# Patient Record
Sex: Female | Born: 1980 | Race: Black or African American | Hispanic: No | Marital: Married | Smoking: Never smoker
Health system: Southern US, Community
[De-identification: ages and names within clinical notes are randomized; demographics above are authoritative.]

## PROBLEM LIST (undated history)

## (undated) DIAGNOSIS — F419 Anxiety disorder, unspecified: Secondary | ICD-10-CM

## (undated) DIAGNOSIS — G43909 Migraine, unspecified, not intractable, without status migrainosus: Secondary | ICD-10-CM

## (undated) DIAGNOSIS — F32A Depression, unspecified: Secondary | ICD-10-CM

## (undated) DIAGNOSIS — IMO0002 Reserved for concepts with insufficient information to code with codable children: Secondary | ICD-10-CM

## (undated) HISTORY — PX: TUBAL LIGATION: SHX77

## (undated) HISTORY — DX: Depression, unspecified: F32.A

## (undated) HISTORY — DX: Anxiety disorder, unspecified: F41.9

## (undated) HISTORY — PX: CHOLECYSTECTOMY: SHX55

## (undated) HISTORY — PX: OTHER SURGICAL HISTORY: SHX169

---

## 1998-04-23 ENCOUNTER — Emergency Department (HOSPITAL_COMMUNITY): Admission: EM | Admit: 1998-04-23 | Discharge: 1998-04-23 | Payer: Self-pay | Admitting: *Deleted

## 1998-09-03 ENCOUNTER — Emergency Department (HOSPITAL_COMMUNITY): Admission: EM | Admit: 1998-09-03 | Discharge: 1998-09-03 | Payer: Self-pay | Admitting: Emergency Medicine

## 1998-09-05 ENCOUNTER — Encounter: Payer: Self-pay | Admitting: Emergency Medicine

## 1998-09-05 ENCOUNTER — Emergency Department (HOSPITAL_COMMUNITY): Admission: EM | Admit: 1998-09-05 | Discharge: 1998-09-05 | Payer: Self-pay | Admitting: Emergency Medicine

## 1998-09-09 ENCOUNTER — Ambulatory Visit (HOSPITAL_COMMUNITY): Admission: RE | Admit: 1998-09-09 | Discharge: 1998-09-09 | Payer: Self-pay | Admitting: Emergency Medicine

## 1998-09-09 ENCOUNTER — Encounter: Payer: Self-pay | Admitting: Emergency Medicine

## 1998-10-26 ENCOUNTER — Inpatient Hospital Stay (HOSPITAL_COMMUNITY): Admission: AD | Admit: 1998-10-26 | Discharge: 1998-10-26 | Payer: Self-pay | Admitting: Obstetrics and Gynecology

## 1998-10-27 ENCOUNTER — Encounter: Payer: Self-pay | Admitting: Obstetrics and Gynecology

## 1998-11-02 ENCOUNTER — Other Ambulatory Visit: Admission: RE | Admit: 1998-11-02 | Discharge: 1998-11-02 | Payer: Self-pay | Admitting: Obstetrics and Gynecology

## 1998-11-02 ENCOUNTER — Ambulatory Visit (HOSPITAL_COMMUNITY): Admission: RE | Admit: 1998-11-02 | Discharge: 1998-11-02 | Payer: Self-pay | Admitting: *Deleted

## 1999-02-21 ENCOUNTER — Encounter: Payer: Self-pay | Admitting: Emergency Medicine

## 1999-02-21 ENCOUNTER — Emergency Department (HOSPITAL_COMMUNITY): Admission: EM | Admit: 1999-02-21 | Discharge: 1999-02-21 | Payer: Self-pay | Admitting: Emergency Medicine

## 1999-03-24 ENCOUNTER — Inpatient Hospital Stay (HOSPITAL_COMMUNITY): Admission: AD | Admit: 1999-03-24 | Discharge: 1999-03-24 | Payer: Self-pay | Admitting: Obstetrics & Gynecology

## 1999-05-16 ENCOUNTER — Inpatient Hospital Stay (HOSPITAL_COMMUNITY): Admission: AD | Admit: 1999-05-16 | Discharge: 1999-05-16 | Payer: Self-pay | Admitting: Obstetrics and Gynecology

## 1999-06-27 ENCOUNTER — Inpatient Hospital Stay (HOSPITAL_COMMUNITY): Admission: AD | Admit: 1999-06-27 | Discharge: 1999-06-27 | Payer: Self-pay | Admitting: Obstetrics and Gynecology

## 1999-06-27 ENCOUNTER — Encounter: Payer: Self-pay | Admitting: Obstetrics and Gynecology

## 1999-08-09 ENCOUNTER — Inpatient Hospital Stay (HOSPITAL_COMMUNITY): Admission: AD | Admit: 1999-08-09 | Discharge: 1999-08-09 | Payer: Self-pay | Admitting: Obstetrics and Gynecology

## 1999-09-19 ENCOUNTER — Inpatient Hospital Stay (HOSPITAL_COMMUNITY): Admission: AD | Admit: 1999-09-19 | Discharge: 1999-09-19 | Payer: Self-pay | Admitting: Obstetrics and Gynecology

## 1999-10-09 ENCOUNTER — Inpatient Hospital Stay (HOSPITAL_COMMUNITY): Admission: AD | Admit: 1999-10-09 | Discharge: 1999-10-11 | Payer: Self-pay | Admitting: Obstetrics and Gynecology

## 2000-10-11 ENCOUNTER — Inpatient Hospital Stay (HOSPITAL_COMMUNITY): Admission: AD | Admit: 2000-10-11 | Discharge: 2000-10-11 | Payer: Self-pay | Admitting: Obstetrics and Gynecology

## 2000-10-18 ENCOUNTER — Inpatient Hospital Stay (HOSPITAL_COMMUNITY): Admission: AD | Admit: 2000-10-18 | Discharge: 2000-10-18 | Payer: Self-pay | Admitting: Obstetrics & Gynecology

## 2000-10-18 ENCOUNTER — Ambulatory Visit (HOSPITAL_COMMUNITY): Admission: RE | Admit: 2000-10-18 | Discharge: 2000-10-18 | Payer: Self-pay | Admitting: *Deleted

## 2000-10-18 ENCOUNTER — Encounter: Payer: Self-pay | Admitting: Obstetrics and Gynecology

## 2000-10-20 ENCOUNTER — Inpatient Hospital Stay (HOSPITAL_COMMUNITY): Admission: AD | Admit: 2000-10-20 | Discharge: 2000-10-20 | Payer: Self-pay | Admitting: Obstetrics & Gynecology

## 2001-01-03 ENCOUNTER — Other Ambulatory Visit: Admission: RE | Admit: 2001-01-03 | Discharge: 2001-01-03 | Payer: Self-pay | Admitting: Obstetrics and Gynecology

## 2001-08-02 ENCOUNTER — Encounter: Payer: Self-pay | Admitting: Emergency Medicine

## 2001-08-02 ENCOUNTER — Emergency Department (HOSPITAL_COMMUNITY): Admission: EM | Admit: 2001-08-02 | Discharge: 2001-08-02 | Payer: Self-pay | Admitting: Emergency Medicine

## 2001-08-26 ENCOUNTER — Encounter: Payer: Self-pay | Admitting: Obstetrics and Gynecology

## 2001-08-26 ENCOUNTER — Ambulatory Visit (HOSPITAL_COMMUNITY): Admission: RE | Admit: 2001-08-26 | Discharge: 2001-08-26 | Payer: Self-pay | Admitting: Obstetrics and Gynecology

## 2001-12-31 ENCOUNTER — Other Ambulatory Visit: Admission: RE | Admit: 2001-12-31 | Discharge: 2001-12-31 | Payer: Self-pay | Admitting: Obstetrics and Gynecology

## 2002-02-05 ENCOUNTER — Encounter: Payer: Self-pay | Admitting: Obstetrics and Gynecology

## 2002-02-05 ENCOUNTER — Ambulatory Visit (HOSPITAL_COMMUNITY): Admission: RE | Admit: 2002-02-05 | Discharge: 2002-02-05 | Payer: Self-pay | Admitting: Obstetrics and Gynecology

## 2002-05-31 ENCOUNTER — Inpatient Hospital Stay (HOSPITAL_COMMUNITY): Admission: AD | Admit: 2002-05-31 | Discharge: 2002-05-31 | Payer: Self-pay | Admitting: *Deleted

## 2002-06-07 ENCOUNTER — Inpatient Hospital Stay (HOSPITAL_COMMUNITY): Admission: AD | Admit: 2002-06-07 | Discharge: 2002-06-07 | Payer: Self-pay | Admitting: Obstetrics and Gynecology

## 2002-06-22 ENCOUNTER — Encounter (INDEPENDENT_AMBULATORY_CARE_PROVIDER_SITE_OTHER): Payer: Self-pay | Admitting: Specialist

## 2002-06-22 ENCOUNTER — Inpatient Hospital Stay (HOSPITAL_COMMUNITY): Admission: AD | Admit: 2002-06-22 | Discharge: 2002-06-24 | Payer: Self-pay | Admitting: Obstetrics and Gynecology

## 2002-11-15 ENCOUNTER — Emergency Department (HOSPITAL_COMMUNITY): Admission: EM | Admit: 2002-11-15 | Discharge: 2002-11-15 | Payer: Self-pay | Admitting: Emergency Medicine

## 2002-11-18 ENCOUNTER — Emergency Department (HOSPITAL_COMMUNITY): Admission: EM | Admit: 2002-11-18 | Discharge: 2002-11-18 | Payer: Self-pay | Admitting: *Deleted

## 2002-11-19 ENCOUNTER — Encounter: Payer: Self-pay | Admitting: Emergency Medicine

## 2003-01-14 ENCOUNTER — Encounter: Admission: RE | Admit: 2003-01-14 | Discharge: 2003-01-14 | Payer: Self-pay | Admitting: Gastroenterology

## 2003-01-14 ENCOUNTER — Encounter: Payer: Self-pay | Admitting: Gastroenterology

## 2003-03-18 ENCOUNTER — Ambulatory Visit (HOSPITAL_COMMUNITY): Admission: RE | Admit: 2003-03-18 | Discharge: 2003-03-19 | Payer: Self-pay | Admitting: Surgery

## 2003-03-18 ENCOUNTER — Encounter (INDEPENDENT_AMBULATORY_CARE_PROVIDER_SITE_OTHER): Payer: Self-pay | Admitting: Specialist

## 2003-08-07 ENCOUNTER — Emergency Department (HOSPITAL_COMMUNITY): Admission: EM | Admit: 2003-08-07 | Discharge: 2003-08-07 | Payer: Self-pay | Admitting: Emergency Medicine

## 2004-11-02 ENCOUNTER — Encounter: Admission: RE | Admit: 2004-11-02 | Discharge: 2004-11-02 | Payer: Self-pay | Admitting: Internal Medicine

## 2005-03-12 ENCOUNTER — Emergency Department (HOSPITAL_COMMUNITY): Admission: EM | Admit: 2005-03-12 | Discharge: 2005-03-12 | Payer: Self-pay | Admitting: Emergency Medicine

## 2006-12-02 ENCOUNTER — Emergency Department (HOSPITAL_COMMUNITY): Admission: EM | Admit: 2006-12-02 | Discharge: 2006-12-02 | Payer: Self-pay | Admitting: Emergency Medicine

## 2007-06-03 ENCOUNTER — Emergency Department (HOSPITAL_COMMUNITY): Admission: EM | Admit: 2007-06-03 | Discharge: 2007-06-03 | Payer: Self-pay | Admitting: Emergency Medicine

## 2007-11-01 ENCOUNTER — Encounter: Admission: RE | Admit: 2007-11-01 | Discharge: 2007-11-01 | Payer: Self-pay | Admitting: Internal Medicine

## 2008-12-18 ENCOUNTER — Encounter: Admission: RE | Admit: 2008-12-18 | Discharge: 2008-12-18 | Payer: Self-pay | Admitting: Internal Medicine

## 2009-09-02 ENCOUNTER — Emergency Department (HOSPITAL_COMMUNITY): Admission: EM | Admit: 2009-09-02 | Discharge: 2009-09-02 | Payer: Self-pay | Admitting: Emergency Medicine

## 2010-09-03 LAB — DIFFERENTIAL
Eosinophils Absolute: 0.1 10*3/uL (ref 0.0–0.7)
Eosinophils Relative: 2 % (ref 0–5)
Lymphocytes Relative: 18 % (ref 12–46)
Lymphs Abs: 0.8 10*3/uL (ref 0.7–4.0)
Monocytes Absolute: 0.4 10*3/uL (ref 0.1–1.0)
Neutro Abs: 3.3 10*3/uL (ref 1.7–7.7)
Neutrophils Relative %: 71 % (ref 43–77)

## 2010-09-03 LAB — POCT I-STAT, CHEM 8
Calcium, Ion: 1.2 mmol/L (ref 1.12–1.32)
Chloride: 112 mEq/L (ref 96–112)
Potassium: 4.1 mEq/L (ref 3.5–5.1)

## 2010-09-03 LAB — URINALYSIS, ROUTINE W REFLEX MICROSCOPIC
Glucose, UA: NEGATIVE mg/dL
Ketones, ur: NEGATIVE mg/dL
Urobilinogen, UA: 1 mg/dL (ref 0.0–1.0)
pH: 7 (ref 5.0–8.0)

## 2010-09-03 LAB — CBC
HCT: 39.6 % (ref 36.0–46.0)
Hemoglobin: 12.6 g/dL (ref 12.0–15.0)
MCHC: 31.9 g/dL (ref 30.0–36.0)
Platelets: 166 10*3/uL (ref 150–400)

## 2010-09-03 LAB — POCT PREGNANCY, URINE: Preg Test, Ur: NEGATIVE

## 2010-10-27 NOTE — Op Note (Signed)
NAME:  Kimberly Snyder, Kimberly Snyder                         ACCOUNT NO.:  [id number]   MEDICAL RECORD NO.:  [id number]                   PATIENT TYPE:  OIB   LOCATION:  5727                                 FACILITY:  [facility]   PHYSICIAN:  Abigail Miyamoto, M.D.              DATE OF BIRTH:  11/22/1980   DATE OF PROCEDURE:  03/18/2003  DATE OF DISCHARGE:                                 OPERATIVE REPORT   PREOPERATIVE DIAGNOSIS:  Biliary dyskinesia.   POSTOPERATIVE DIAGNOSIS:  Biliary dyskinesia.   PROCEDURE:  Laparoscopic cholecystectomy.   SURGEON:  Douglas Snyder. Magnus Ivan, M.D.   ANESTHESIA:  General endotracheal anesthesia.   ESTIMATED BLOOD LOSS:  Minimal.   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   PROCEDURE IN DETAIL:  The patient was brought to the operating room and  identified as [name]  She was placed supine on the operating table,  general anesthesia was induced.  Her abdomen was then prepped and draped in  the usual sterile fashion.  Using Snyder 15 blade, Snyder small transverse incision  was made below the umbilicus.  This was carried down to the fascia which was  opened with the scalpel.  Snyder hemostat was then used to pass through the  peritoneal cavity.  Snyder 0 Vicryl purse-string suture was then placed around  the fascial opening.  Snyder Hasson port was placed through the opening and  insufflation of the abdomen was begun.  An 12 mm port was placed in the  patient's epigastrium and two 5 mm ports were placed in the patient's right  flank under direct vision.   The gallbladder was grasped and retracted above the liver bed.  Multiple  adhesions were found to the gallbladder and the duodenum appeared slightly  adhesed to the gallbladder, these were all taken down bluntly.  The cystic  duct was then dissected out, clipped once distally.  The cystic artery was  clipped twice proximally and once distally and transected with  scissors.  The cystic duct was then opened partly.  It then became apparent that  cholangiogram would not be available secondary to machine malfunctioning.  At this point, the decision was made to go ahead and clip the cystic duct  three times proximally and completely transect it.  The The gallbladder was  then slowly dissected free from the liver bed with the electrocautery.  Hemostasis was achieved in the liver bed with electrocautery.  The  gallbladder was removed through the incision at the umbilicus.  The 0 Vicryl  in the umbilicus was tied to close the fascial defect.  The abdomen was  irrigated with normal saline.  Again, hemostasis appeared to be achieved.  All ports were removed under direct vision and the abdomen was deflated.  All incisions were anesthetized with 0.25% Marcaine and closed with 4-0  Vicryl subcuticular sutures.  Steri-Strips, gauze,  and tape was  applied.  The patient tolerated the procedure well.  All sponge, needle and  instrument counts were correct at the end of the procedure.  The patient was  then extubated in the operating room and taken in stable condition to the  recovery room.                                               Abigail Miyamoto, M.D.    DB/MEDQ  D:  03/18/2003  T:  03/18/2003  Job:  [id number]   cc:   Anselmo Rod, M.D.  [address]  [address] [place]  [place]  [place] [address]  Fax: [phone]

## 2010-10-27 NOTE — H&P (Signed)
NAME:  Kimberly Snyder, Kimberly Snyder                         ACCOUNT NO.:  [id number]   MEDICAL RECORD NO.:  [id number]                   PATIENT TYPE:  INP   LOCATION:  9145                                 [facility]  [facility]   PHYSICIAN:  Malachi Pro. Ambrose Mantle, M.D.              DATE OF BIRTH:  November 13, 1980   DATE OF ADMISSION:  06/22/2002  DATE OF DISCHARGE:                                HISTORY & PHYSICAL   HISTORY OF PRESENT ILLNESS:  This is Snyder 30 year old black married female para  1-0-4-1, gravida 6 with EDC of July 06, 2002 by ultrasound admitted in  labor.  Blood group and type was O+ with Snyder negative antibody.  Sickle cell  negative.  RPR nonreactive.  Rubella immune.  Hepatitis B surface antigen  negative.  HIV negative.  GC and Chlamydia negative.  Triple screen normal.  One hour Glucola 116.  Group B Strep negative.  The patient had Snyder fairly  uncomplicated prenatal course that is detailed more in the discharge  summary.  She began contracting at 3 Snyder.m. on the day of admission.  Came to  maternity admission at 8:30 Snyder.m.  Her cervix was thought to be 4-5 cm by  nurse Kim [place].  After four hours of observation the cervix had changed  minimally.  The patient continued to contract and she was admitted.   ALLERGIES:  No known allergies.   PAST SURGICAL HISTORY:  No operations.   PAST MEDICAL HISTORY:  She did have migraines.   SOCIAL HISTORY:  Alcohol, tobacco, and drugs:  None.   FAMILY HISTORY:  Mother with high blood pressure, thyroid dysfunction.  Maternal aunt, maternal grandmothers with MIs.  Maternal grandmother with  heart disease.  Maternal grandfather with colon cancer.   OBSTETRIC HISTORY:  In 1995 she had an early abortion.  In 1999 spontaneous  abortion.  In April 2001 6 pound 6 ounce female delivered vaginally.  In 2001  and 2002 she had spontaneous abortions.   PHYSICAL EXAMINATION:  VITAL SIGNS:  Normal vital signs.  ABDOMEN:  Soft.  Fundal height was 36 cm on June 19, 2002.  Fetal heart  tones were normal with short variable decelerations.  There were excellent  accelerations.  PELVIC:  The cervix was 4 cm, 30%, vertex at Snyder -3.   HOSPITAL COURSE:  The patient was placed on Pitocin.  By 7:10 p.m. the  Pitocin was at 7 milliunits Snyder minute.  Contractions were every two to three  minutes.  Fetal heart tones were normal.  The cervix was 4 cm, 70%, vertex  at Snyder -2.  Artificial rupture of the membranes was performed with clear  fluid.  The patient requested an epidural.  She had received an epidural and  progressed quickly to full dilatation.  She delivered spontaneously OA with  intact perineum with pushes during two contractions Snyder living female infant 6  pounds 2 ounces,  Apgars 9 at one and 9 at five minutes.  Malachi Pro. Ambrose Mantle,  M.D. was in attendance.  The placenta was intact.  Uterus normal.  Rectal  negative.  Small right inner labial laceration close to the clitoris was  hemostatic and not repaired.  Blood loss was 300 cubic centimeters.  The  patient requested tubal ligation.  I went over thoroughly the risks of  proceeding with that procedure.  I did that with her and her husband after  the delivery and also on the first postpartum day.  She persisted in her  desire to go ahead with the tubal.    IMPRESSION:  1. Intrauterine pregnancy at 38 weeks, delivered.  2. Voluntary sterilization.  The patient is prepared for tubal ligation.                                               Malachi Pro. Ambrose Mantle, M.D.    TFH/MEDQ  D:  06/23/2002  T:  06/23/2002  Job:  [id number]

## 2010-10-27 NOTE — Discharge Summary (Signed)
NAME:  Kimberly Snyder, Kimberly Snyder                         ACCOUNT NO.:  [id number]   MEDICAL RECORD NO.:  [id number]                   PATIENT TYPE:  INP   LOCATION:  9145                                 [facility]   PHYSICIAN:  Malachi Pro. Ambrose Mantle, M.D.              DATE OF BIRTH:  1980-12-22   DATE OF ADMISSION:  06/22/2002  DATE OF DISCHARGE:  06/24/2002                                 DISCHARGE SUMMARY   HISTORY OF PRESENT ILLNESS:  Snyder 30 year old black married female, para 1-0-4-  1, gravida 8, [facility] July 06, 2002, by ultrasound, admitted in labor.  Blood  group and type O positive.  Negative antibodies.  Sickle cell negative.  RPR  nonreactive.  Rubella immune.  Hepatitis B surface antigen negative.  HIV  negative.  GC and Chlamydia negative.  Triple screen normal.  One-hour  Glucola 116.  Group B strep negative.  Vaginal ultrasound on December 04, 2001:  Crown-rump length 2.59 cm, 9 weeks 3 days, [facility] July 06, 2002.  Urinary  tract infection with Citrobacter was treated with Septra DS.  Repeat  ultrasound on February 05, 2002:  Average gestational age [redacted] weeks 3 days, [facility]  July 06, 2002.  The patient's prenatal course was otherwise  uncomplicated.  She began contracting at 3 Snyder.m. on the day of admission.  She came to [facility] at 8:30 Snyder.m.  The cervix was 4-5 cm by the nurse, Kim  [place].  On June 19, 2002, the cervix had been 3 cm and 30%, vertex with Snyder  minus 3 by Dr. Ashok Norris in our office.   PAST MEDICAL HISTORY:  Revealed no known allergies.  No operations.  Illnesses:  Migraine headaches.  Alcohol, tobacco, and drugs:  None.   FAMILY HISTORY:  Mother with high blood pressure and thyroid dysfunction.  Maternal aunt and maternal grandmother with MIs.  Maternal grandmother with  heart disease.  Maternal grandfather, colon cancer.   OBSTETRIC HISTORY:  In 1995 and 1999, she had early abortion and spontaneous  abortion, respectively.  In April 2001, Snyder 6-pound 6-ounce female delivered  vaginally.  In 2001 and 2002, she had spontaneous abortions.   HOSPITAL COURSE:  On admission, her vital signs were normal.  Fundal height  was 36 cm on June 19, 2002.  Fetal heart tones were normal with short  variable decelerations but excellent accelerations.  The cervix was 4 cm,  30%, vertex at Snyder minus 3.  The patient was observed in maternity admissions  for about four hours without significant progress except the cervix may have  shortened some, and because she was contracting regularly, I elected to  admit her to the hospital to augment the labor.  She was placed on Pitocin.  By 7:10 p.m., the contractions were every 2-3 minutes on 7 mU/minute of  Pitocin.  Fetal heart tones were normal.  Cervix was 4 cm, 70%, vertex with  Snyder  minus 2.  Artificial rupture of the membranes produced clear fluid.  The  patient requested an epidural.  She received an epidural and progressed  quickly to full dilatation.  She delivered spontaneously OA over an intact  perineum with pushes during two contractions, Snyder living female infant, 6  pounds 2 ounces, Apgars of 9 at one and 9 at five minutes.  Dr. Ambrose Mantle was  in attendance.  Placenta was intact.  Uterus normal, rectal negative.  Small  right inner labial laceration close to the clitoris was hemostatic and not  repaired.  Blood loss was about 300 cc.  The patient requested tubal  ligation.  I discussed at length with her and her husband the disadvantages  of proceeding with tubal ligation at such Snyder young age and so early post  partum, but the patient persisted in her desire to have her tubes tied.  She  underwent Snyder tubal ligation on June 23, 2002, by Dr. Ambrose Mantle, under  epidural anesthesia and on the first postoperative day, she was ready for  discharge.  Initial hemoglobin 9.6, hematocrit 29.1, white count 9600,  platelet count 239,000.  Followup hemoglobin 9.8.  Snyder postoperative  hemoglobin was not done.   FINAL DIAGNOSES:  Intrauterine pregnancy  at 38 weeks, delivered.  Voluntary  sterilization operation.  Spontaneous delivery, vertex.  Bilateral tubal  ligation.   FINAL CONDITION:  Improved.   DISCHARGE INSTRUCTIONS:  Our regular discharge instruction booklet.  The  patient is given Snyder prescription for Percocet 5/325, #16, 1 q.4-6h. p.r.n.  pain, and is advised to return to the office in 10 days for followup  examination.                                                 Malachi Pro. Ambrose Mantle, M.D.    TFH/MEDQ  D:  06/24/2002  T:  06/24/2002  Job:  [id number]

## 2011-05-05 ENCOUNTER — Emergency Department (HOSPITAL_COMMUNITY): Payer: BC Managed Care – PPO

## 2011-05-05 ENCOUNTER — Encounter: Payer: Self-pay | Admitting: *Deleted

## 2011-05-05 ENCOUNTER — Emergency Department (HOSPITAL_COMMUNITY)
Admission: EM | Admit: 2011-05-05 | Discharge: 2011-05-05 | Disposition: A | Discharge status: Home / Self Care | Payer: BC Managed Care – PPO | Attending: Emergency Medicine | Admitting: Emergency Medicine

## 2011-05-05 DIAGNOSIS — S20219A Contusion of unspecified front wall of thorax, initial encounter: Secondary | ICD-10-CM

## 2011-05-05 DIAGNOSIS — M25519 Pain in unspecified shoulder: Secondary | ICD-10-CM | POA: Insufficient documentation

## 2011-05-05 DIAGNOSIS — W1809XA Striking against other object with subsequent fall, initial encounter: Secondary | ICD-10-CM | POA: Insufficient documentation

## 2011-05-05 DIAGNOSIS — R0602 Shortness of breath: Secondary | ICD-10-CM | POA: Insufficient documentation

## 2011-05-05 DIAGNOSIS — R209 Unspecified disturbances of skin sensation: Secondary | ICD-10-CM | POA: Insufficient documentation

## 2011-05-05 DIAGNOSIS — R079 Chest pain, unspecified: Secondary | ICD-10-CM | POA: Insufficient documentation

## 2011-05-05 HISTORY — DX: Reserved for concepts with insufficient information to code with codable children: IMO0002

## 2011-05-05 MED ORDER — OXYCODONE-ACETAMINOPHEN 5-325 MG PO TABS: 1.0000 | ORAL_TABLET | Freq: Four times a day (QID) | ORAL | Status: AC | PRN

## 2011-05-05 MED ORDER — IBUPROFEN 600 MG PO TABS: 600.0000 mg | ORAL_TABLET | Freq: Four times a day (QID) | ORAL | Status: AC | PRN

## 2011-05-05 MED ORDER — HYDROMORPHONE HCL PF 1 MG/ML IJ SOLN
1.0000 mg | Freq: Once | INTRAMUSCULAR | Status: AC
  Administered 2011-05-05: 1 mg via INTRAMUSCULAR
  Filled 2011-05-05: qty 1

## 2011-05-05 MED ORDER — KETOROLAC TROMETHAMINE 30 MG/ML IJ SOLN
30.0000 mg | Freq: Once | INTRAMUSCULAR | Status: AC
  Administered 2011-05-05: 30 mg via INTRAVENOUS
  Filled 2011-05-05: qty 1

## 2011-05-05 NOTE — ED Provider Notes (Signed)
History     CSN: [id number] Arrival date & time: 05/05/2011  8:39 PM   First MD Initiated Contact with Patient 05/05/11 2047      Chief Complaint  Patient presents with  . Shoulder Pain    Pt fell over a baby gate onto a marble floor. Pt complains of right hand, shoulder, neck, back, and knee pain.    (Consider location/radiation/quality/duration/timing/severity/associated sxs/prior treatment) HPI Comments: 4 days ago.  This patient was coming down the steps, tripped over a baby gate falling forward and landing on her right side.  Since then has had right upper chest pain predating to her lower right ribs posteriorly.  She has significant pain with movement breathing.  She has tried heat.  Vicodin and lying in the bed without any relief.  Denies fever  Patient is a 30 y.o. female presenting with shoulder pain. The history is provided by the patient.  Shoulder Pain This is a new problem. The current episode started in the past 7 days. The problem occurs constantly. The problem has been gradually worsening. Associated symptoms include chest pain and numbness. Pertinent negatives include no abdominal pain, arthralgias, fever, headaches, joint swelling, myalgias, nausea or neck pain. The symptoms are aggravated by twisting, walking and exertion. She has tried acetaminophen, heat and lying down for the symptoms. The treatment provided no relief.    Past Medical History  Diagnosis Date  . Chiari malformation     Past Surgical History  Procedure Date  . Cholecystectomy   . Tubal ligation   . Scleral buckling     History reviewed. No pertinent family history.  History  Substance Use Topics  . Smoking status: Not on file  . Smokeless tobacco: Not on file  . Alcohol Use:     OB History    Grav Para Term Preterm Abortions TAB SAB Ect Mult Living                  Review of Systems  Constitutional: Negative for fever.  HENT: Negative for neck pain.   Eyes: Negative.     Respiratory: Negative.   Cardiovascular: Positive for chest pain.  Gastrointestinal: Negative for nausea and abdominal pain.  Musculoskeletal: Negative for myalgias, joint swelling and arthralgias.  Skin: Negative.   Neurological: Positive for numbness. Negative for headaches.  Hematological: Negative.   Psychiatric/Behavioral: Negative.     Allergies  Review of patient's allergies indicates no known allergies.  Home Medications   Current Outpatient Rx  Name Route Sig Dispense Refill  . AMPHETAMINE-DEXTROAMPHETAMINE 20 MG PO TABS Oral Take 20 mg by mouth 3 (three) times daily.      . BUPROPION HCL ER (XL) 300 MG PO TB24 Oral Take 300 mg by mouth daily.      . CELECOXIB 200 MG PO CAPS Oral Take 200 mg by mouth 2 (two) times daily.      Marland Kitchen ESCITALOPRAM OXALATE 20 MG PO TABS Oral Take 20 mg by mouth daily.      Marland Kitchen HYDROCODONE-ACETAMINOPHEN 5-325 MG PO TABS Oral Take 1 tablet by mouth every 6 (six) hours as needed. For pain.     . TOPIRAMATE 25 MG PO TABS Oral Take 50 mg by mouth 2 (two) times daily.      . IBUPROFEN 600 MG PO TABS Oral Take 1 tablet (600 mg total) by mouth every 6 (six) hours as needed for pain. 30 tablet 0  . OXYCODONE-ACETAMINOPHEN 5-325 MG PO TABS Oral Take 1-2 tablets by mouth  every 6 (six) hours as needed for pain. 20 tablet 0    BP 121/67  Pulse 88  Temp(Src) 98.4 F (36.9 C) (Oral)  Resp 20  SpO2 100%  LMP 04/04/2011  Physical Exam  Constitutional: She is oriented to person, place, and time. She appears well-developed and well-nourished.  Eyes: EOM are normal.  Neck: Normal range of motion.  Cardiovascular: Normal rate.   Pulmonary/Chest: No respiratory distress. She has no wheezes. She has no rales.   She exhibits tenderness.       Pain, no bruising   Abdominal: Soft. Bowel sounds are normal.  Neurological: She is oriented to person, place, and time.  Skin: Skin is warm and dry.  Psychiatric: She has a normal mood and affect.    ED Course   Procedures (including critical care time)  Labs Reviewed - No data to display Dg Ribs Unilateral W/chest Right  05/05/2011  *RADIOLOGY REPORT*  Clinical Data: Tripped over gate and fell; right upper posterior rib and scapula pain.  Pain with breathing, and shortness of breath.  RIGHT RIBS AND CHEST - 3+ VIEW  Comparison: Thoracic spine radiographs performed 01/04/2011, and left shoulder radiographs performed 03/07/2010  Findings: No displaced rib fractures are seen.  The right scapula appears intact.  The lungs are well-aerated and clear.  There is no evidence of focal opacification, pleural effusion or pneumothorax.  The cardiomediastinal silhouette is within normal limits.  No acute osseous abnormalities are seen.  Clips are noted within the right upper quadrant, reflecting prior cholecystectomy.  IMPRESSION: No displaced rib fractures seen; the right scapula appears intact. No acute cardiopulmonary process identified.  Original Report Authenticated By: Tonia Ghent, M.D.     1. Chest wall contusion       MDM  Chest wall contusion Fractured ribs         Arman Filter, NP 05/05/11 2114  Arman Filter, NP 05/05/11 1191  Arman Filter, NP 05/05/11 2221

## 2011-05-05 NOTE — ED Notes (Signed)
Pt fell over a baby gate onto a marble floor landing on her right knee and rolled to the right before hitting the ground then landing on the right side of her back.

## 2011-05-06 NOTE — ED Provider Notes (Signed)
Medical screening examination/treatment/procedure(s) were performed by non-physician practitioner and as supervising physician I was immediately available for consultation/collaboration.   Lyanne Co, MD 05/06/11 [phone]

## 2011-07-27 ENCOUNTER — Encounter (HOSPITAL_COMMUNITY): Payer: Self-pay | Admitting: *Deleted

## 2011-07-27 ENCOUNTER — Emergency Department (HOSPITAL_COMMUNITY)
Admission: EM | Admit: 2011-07-27 | Discharge: 2011-07-27 | Disposition: A | Discharge status: Home / Self Care | Payer: BC Managed Care – PPO | Attending: Emergency Medicine | Admitting: Emergency Medicine

## 2011-07-27 DIAGNOSIS — G43909 Migraine, unspecified, not intractable, without status migrainosus: Secondary | ICD-10-CM

## 2011-07-27 MED ORDER — SODIUM CHLORIDE 0.9 % IV BOLUS (SEPSIS)
1000.0000 mL | Freq: Once | INTRAVENOUS | Status: AC
  Administered 2011-07-27: 1000 mL via INTRAVENOUS

## 2011-07-27 MED ORDER — HYDROMORPHONE HCL PF 1 MG/ML IJ SOLN
1.0000 mg | Freq: Once | INTRAMUSCULAR | Status: AC
  Administered 2011-07-27: 1 mg via INTRAVENOUS
  Filled 2011-07-27: qty 1

## 2011-07-27 MED ORDER — DEXAMETHASONE SODIUM PHOSPHATE 10 MG/ML IJ SOLN
10.0000 mg | Freq: Once | INTRAMUSCULAR | Status: AC
  Administered 2011-07-27: 10 mg via INTRAVENOUS
  Filled 2011-07-27: qty 1

## 2011-07-27 MED ORDER — METOCLOPRAMIDE HCL 5 MG/ML IJ SOLN
10.0000 mg | Freq: Once | INTRAMUSCULAR | Status: AC
  Administered 2011-07-27: 10 mg via INTRAVENOUS
  Filled 2011-07-27: qty 2

## 2011-07-27 MED ORDER — LORAZEPAM 2 MG/ML IJ SOLN
1.0000 mg | Freq: Once | INTRAMUSCULAR | Status: AC
  Administered 2011-07-27: 1 mg via INTRAVENOUS
  Filled 2011-07-27: qty 1

## 2011-07-27 MED ORDER — SODIUM CHLORIDE 0.9 % IV SOLN: Freq: Once | INTRAVENOUS | Status: DC

## 2011-07-27 NOTE — ED Provider Notes (Signed)
History     CSN: [id number]  Arrival date & time 07/27/11  1114   First MD Initiated Contact with Patient 07/27/11 1119      Chief Complaint  Patient presents with  . Migraine    (Consider location/radiation/quality/duration/timing/severity/associated sxs/prior treatment) Patient is a 31 y.o. female presenting with migraine. The history is provided by the patient.  Migraine This is a recurrent problem. The current episode started more than 2 days ago. The problem occurs constantly. The problem has been gradually worsening. Associated symptoms include headaches. Pertinent negatives include no chest pain and no abdominal pain. The symptoms are aggravated by nothing. The symptoms are relieved by nothing.  h/o chronic ha x 1 month, seen by neurologist, on meds that aren't working--headache similar to prior  Past Medical History  Diagnosis Date  . Chiari malformation     Past Surgical History  Procedure Date  . Cholecystectomy   . Tubal ligation   . Scleral buckling     History reviewed. No pertinent family history.  History  Substance Use Topics  . Smoking status: Not on file  . Smokeless tobacco: Not on file  . Alcohol Use:     OB History    Grav Para Term Preterm Abortions TAB SAB Ect Mult Living                  Review of Systems  Cardiovascular: Negative for chest pain.  Gastrointestinal: Negative for abdominal pain.  Neurological: Positive for headaches.  All other systems reviewed and are negative.    Allergies  Review of patient's allergies indicates no known allergies.  Home Medications   Current Outpatient Rx  Name Route Sig Dispense Refill  . AMPHETAMINE-DEXTROAMPHETAMINE 20 MG PO TABS Oral Take 20 mg by mouth 3 (three) times daily.      . BUPROPION HCL ER (XL) 300 MG PO TB24 Oral Take 300 mg by mouth daily.      . CELECOXIB 200 MG PO CAPS Oral Take 200 mg by mouth 2 (two) times daily.      . DULOXETINE HCL 60 MG PO CPEP Oral Take 60 mg by mouth  daily.    Marland Kitchen HYDROCODONE-ACETAMINOPHEN 5-325 MG PO TABS Oral Take 1 tablet by mouth every 6 (six) hours as needed. For pain.     . TOPIRAMATE 25 MG PO TABS Oral Take 75 mg by mouth 2 (two) times daily.       BP 123/63  Pulse 79  Temp(Src) 98.8 F (37.1 C) (Oral)  Resp 20  Wt 154 lb (69.854 kg)  SpO2 100%  Physical Exam  Nursing note and vitals reviewed. Constitutional: She is oriented to person, place, and time. Vital signs are normal. She appears well-developed and well-nourished.  Non-toxic appearance. No distress.  HENT:  Head: Normocephalic and atraumatic.  Eyes: Conjunctivae, EOM and lids are normal. Pupils are equal, round, and reactive to light.  Neck: Normal range of motion. Neck supple. No tracheal deviation present. No mass present.  Cardiovascular: Normal rate, regular rhythm and normal heart sounds.  Exam reveals no gallop.   No murmur heard. Pulmonary/Chest: Effort normal and breath sounds normal. No stridor. No respiratory distress. She has no decreased breath sounds. She has no wheezes. She has no rhonchi. She has no rales.  Abdominal: Soft. Normal appearance and bowel sounds are normal. She exhibits no distension. There is no tenderness. There is no rebound and no CVA tenderness.  Musculoskeletal: Normal range of motion. She exhibits no edema and  no tenderness.  Neurological: She is alert and oriented to person, place, and time. She has normal strength. No cranial nerve deficit or sensory deficit. GCS eye subscore is 4. GCS verbal subscore is 5. GCS motor subscore is 6.  Skin: Skin is warm and dry. No abrasion and no rash noted.  Psychiatric: She has a normal mood and affect. Her speech is normal and behavior is normal.    ED Course  Procedures (including critical care time)  Labs Reviewed - No data to display No results found.   No diagnosis found.    MDM  Patient given pain medication feels better now. Repeat exam normal. Will be discharged  home        Toy Baker, MD 07/27/11 1406

## 2011-07-27 NOTE — ED Notes (Signed)
Pt in c/o migraine x3 days, denies n/v, sensitivity to light, history of same

## 2011-07-27 NOTE — ED Notes (Signed)
Vital signs stable. 

## 2011-07-27 NOTE — ED Notes (Signed)
Patient is resting comfortably. 

## 2011-07-27 NOTE — ED Notes (Signed)
Family at bedside. 

## 2011-07-27 NOTE — ED Notes (Signed)
MD at bedside. 

## 2011-11-30 ENCOUNTER — Encounter (HOSPITAL_COMMUNITY): Payer: Self-pay | Admitting: Emergency Medicine

## 2011-11-30 ENCOUNTER — Emergency Department (HOSPITAL_COMMUNITY)
Admission: EM | Admit: 2011-11-30 | Discharge: 2011-11-30 | Disposition: A | Discharge status: Home / Self Care | Payer: BC Managed Care – PPO | Attending: Emergency Medicine | Admitting: Emergency Medicine

## 2011-11-30 DIAGNOSIS — R51 Headache: Secondary | ICD-10-CM | POA: Insufficient documentation

## 2011-11-30 HISTORY — DX: Migraine, unspecified, not intractable, without status migrainosus: G43.909

## 2011-11-30 MED ORDER — METOCLOPRAMIDE HCL 5 MG/ML IJ SOLN
10.0000 mg | Freq: Once | INTRAMUSCULAR | Status: AC
  Administered 2011-11-30: 10 mg via INTRAVENOUS
  Filled 2011-11-30: qty 2

## 2011-11-30 MED ORDER — KETOROLAC TROMETHAMINE 30 MG/ML IJ SOLN
30.0000 mg | Freq: Once | INTRAMUSCULAR | Status: AC
  Administered 2011-11-30: 30 mg via INTRAVENOUS
  Filled 2011-11-30: qty 1

## 2011-11-30 MED ORDER — NAPROXEN 500 MG PO TABS: 500.0000 mg | ORAL_TABLET | Freq: Two times a day (BID) | ORAL | Status: AC

## 2011-11-30 MED ORDER — SODIUM CHLORIDE 0.9 % IV BOLUS (SEPSIS)
1000.0000 mL | Freq: Once | INTRAVENOUS | Status: AC
  Administered 2011-11-30: 1000 mL via INTRAVENOUS

## 2011-11-30 MED ORDER — PROMETHAZINE HCL 25 MG PO TABS: 25.0000 mg | ORAL_TABLET | Freq: Four times a day (QID) | ORAL | Status: DC | PRN

## 2011-11-30 MED ORDER — ONDANSETRON HCL 4 MG/2ML IJ SOLN
4.0000 mg | Freq: Once | INTRAMUSCULAR | Status: AC
  Administered 2011-11-30: 4 mg via INTRAVENOUS
  Filled 2011-11-30: qty 2

## 2011-11-30 NOTE — Discharge Instructions (Signed)
Headache:  You are having a headache. No specific cause was found today for your headache. It may have been a migraine or other cause of headache. Stress, anxiety, fatigue, and depression are common triggers for headaches. Your headache today does not appear to be life-threatening or require hospitalization, but often the exact cause of headaches is not determined in the emergency department. Therefore, followup with your doctor is very important to find out what may have caused your headache, and whether or not you need any further diagnostic testing or treatment. Sometimes headaches can appear benign but then more serious symptoms can develop which should prompt an immediate reevaluation by your doctor or the emergency department.  Seek immediate medical attention if:  You develop possible problems with medications prescribed. The medications don't resolve your headache, if it recurs, or if you have multiple episodes of vomiting or can't take fluids by mouth You have a change from the usual headache. If you developed a sudden severe headache or confusion, become poorly responsive or faint, developed a fever above 100.4 or problems breathing, have a change in speech, vision, swallowing or understanding, or developed new weakness, numbness, tingling, incoordination or have a seizure.  If you don't have a family doctor to follow up with, see the follow up list below - call this morning for a follow-up appointment in the next 1-2 days.  RESOURCE GUIDE  Dental Problems  Patients with Medicaid: [facility]                     [facility] [address]                                           [address] Phone:  [phone]                                                  Phone:  [phone]  If unable to pay or uninsured, contact:  [facility] or [facility] to become qualified for the adult dental clinic.  Chronic Pain Problems Contact [facility]  [phone] Patients need to be referred by their primary care doctor.  Insufficient Money for Medicine Contact [employer]:  call "211" or [facility] 271-5999.  No Primary Care Doctor Call [facility]  832-8000 Other agencies that provide inexpensive medical care    [facility]  832-8035    [facility]  [phone]    [facility]  [phone]    [facility] Clinic  832-4777    [facility]  [phone]    [facility]  [phone]  Psychological Services [facility]  [phone] [facility]  [phone] [facility]   800 853-5163 (emergency services 641-4993)  Substance Abuse Resources [facility]  336-882-2125 [facility] 336-784-9470 [facility] 336-285-9073 [facility] 336-845-3988 Residential & Outpatient Substance Abuse Program  800-659-3381  Abuse/Neglect [place] Child Abuse Hotline (336) 641-3795 [place] [facility] Abuse Hotline 800-378-5315 (After Hours)  Emergency Shelter [facility] (336) 271-5985  [facility] (336) 275-9566 [facility] (704) 372-4663  MRSA Hotline #:     832-7006    [place] [facility]  [facility] [place] [facility]     [employer]                          [facility] [address] [place]                       [address]      [address]  [place]                                                [place]                            [place] Phone:  [phone]                                   Phone:  [phone]                 Phone:  [phone]  [facility] Phone:  [phone]  [place] Child Abuse Hotline (336) 342-1394 (336) 342-3537 (After Hours)   

## 2011-11-30 NOTE — ED Provider Notes (Signed)
History     CSN: [id number]  Arrival date & time 11/30/11  8295   First MD Initiated Contact with Patient 11/30/11 0354      Chief Complaint  Patient presents with  . Migraine    (Consider location/radiation/quality/duration/timing/severity/associated sxs/prior treatment) HPI Comments: Pt with  Hx of "migraines per pt and spouse - has 1-2 X  Month and has had ha X 6 hours - gradual in onset, frontal, tried OTC without relief.  Associated n/v.  Similar to prior headaches but stronger in intensity.  No fever or stiff neck.   Patient is a 31 y.o. female presenting with migraine. The history is provided by the patient and the spouse.  Migraine    Past Medical History  Diagnosis Date  . Chiari malformation   . Migraine     Past Surgical History  Procedure Date  . Cholecystectomy   . Tubal ligation   . Scleral buckling     History reviewed. No pertinent family history.  History  Substance Use Topics  . Smoking status: Never Smoker   . Smokeless tobacco: Not on file  . Alcohol Use: No    OB History    Grav Para Term Preterm Abortions TAB SAB Ect Mult Living                  Review of Systems  All other systems reviewed and are negative.    Allergies  Review of patient's allergies indicates no known allergies.  Home Medications   Current Outpatient Rx  Name Route Sig Dispense Refill  . AMPHETAMINE-DEXTROAMPHETAMINE 20 MG PO TABS Oral Take 20 mg by mouth 3 (three) times daily.      . BUPROPION HCL ER (XL) 300 MG PO TB24 Oral Take 300 mg by mouth daily.      . CELECOXIB 200 MG PO CAPS Oral Take 200 mg by mouth 2 (two) times daily.      . DULOXETINE HCL 60 MG PO CPEP Oral Take 60 mg by mouth daily.    Marland Kitchen HYDROCODONE-ACETAMINOPHEN 5-325 MG PO TABS Oral Take 1 tablet by mouth every 6 (six) hours as needed. For pain.     . TOPIRAMATE 25 MG PO TABS Oral Take 75 mg by mouth 2 (two) times daily.     Marland Kitchen NAPROXEN 500 MG PO TABS Oral Take 1 tablet (500 mg total) by mouth  2 (two) times daily with a meal. 30 tablet 0  . PROMETHAZINE HCL 25 MG PO TABS Oral Take 1 tablet (25 mg total) by mouth every 6 (six) hours as needed for nausea. 12 tablet 0    BP 107/62  Pulse 63  Temp 97.5 F (36.4 C) (Oral)  Resp 18  SpO2 100%  LMP 11/18/2011  Physical Exam  Nursing note and vitals reviewed. Constitutional: She appears well-developed and well-nourished.       Uncomfortable appearing, n/v  HENT:  Head: Normocephalic and atraumatic.  Mouth/Throat: Oropharynx is clear and moist. No oropharyngeal exudate.  Eyes: Conjunctivae and EOM are normal. Pupils are equal, round, and reactive to light. Right eye exhibits no discharge. Left eye exhibits no discharge. No scleral icterus.  Neck: Normal range of motion. Neck supple. No JVD present. No thyromegaly present.  Cardiovascular: Normal rate, regular rhythm, normal heart sounds and intact distal pulses.  Exam reveals no gallop and no friction rub.   No murmur heard. Pulmonary/Chest: Effort normal and breath sounds normal. No respiratory distress. She has no wheezes. She has no rales.  Abdominal: Soft. Bowel sounds are normal. She exhibits no distension and no mass. There is no tenderness.  Musculoskeletal: Normal range of motion. She exhibits no edema and no tenderness.  Lymphadenopathy:    She has no cervical adenopathy.  Neurological: She is alert. Coordination normal.       Speech, clear, moves all extremities, alert  Skin: Skin is warm and dry. No rash noted. No erythema.  Psychiatric: She has a normal mood and affect. Her behavior is normal.    ED Course  Procedures (including critical care time)  Labs Reviewed - No data to display No results found.   1. Headache       MDM  Start treatment with intravenous medications and fluids, reevaluate after acute symptoms improved.  Improved 99% with toradol and reglan, fluids and zofran.  VS normal, MS normal, neuro normal on reexam with strength, speech and  gait.  Pt given option of repeat meds vs home, and requests d/c with Rx.  Discharge Prescriptions include:  Naprosyn Phenergan       Vida Roller, MD 11/30/11 [phone]

## 2011-11-30 NOTE — ED Notes (Signed)
Pt states she has a migraine that started about 6 hrs ago  Pt states she has nausea, vomiting, and sensitivity to light

## 2015-09-15 ENCOUNTER — Encounter (HOSPITAL_COMMUNITY): Payer: Self-pay | Admitting: *Deleted

## 2015-09-15 ENCOUNTER — Emergency Department (HOSPITAL_COMMUNITY)
Admission: EM | Admit: 2015-09-15 | Discharge: 2015-09-16 | Disposition: A | Discharge status: Home / Self Care | Payer: 59 | Attending: Emergency Medicine | Admitting: Emergency Medicine

## 2015-09-15 ENCOUNTER — Emergency Department (HOSPITAL_COMMUNITY): Payer: 59

## 2015-09-15 DIAGNOSIS — Z3202 Encounter for pregnancy test, result negative: Secondary | ICD-10-CM | POA: Diagnosis not present

## 2015-09-15 DIAGNOSIS — Z9049 Acquired absence of other specified parts of digestive tract: Secondary | ICD-10-CM | POA: Diagnosis not present

## 2015-09-15 DIAGNOSIS — N801 Endometriosis of ovary: Secondary | ICD-10-CM | POA: Diagnosis not present

## 2015-09-15 DIAGNOSIS — Q0700 Arnold-Chiari syndrome without spina bifida or hydrocephalus: Secondary | ICD-10-CM | POA: Insufficient documentation

## 2015-09-15 DIAGNOSIS — Z79899 Other long term (current) drug therapy: Secondary | ICD-10-CM | POA: Diagnosis not present

## 2015-09-15 DIAGNOSIS — R102 Pelvic and perineal pain: Secondary | ICD-10-CM

## 2015-09-15 DIAGNOSIS — Z8679 Personal history of other diseases of the circulatory system: Secondary | ICD-10-CM | POA: Diagnosis not present

## 2015-09-15 DIAGNOSIS — R079 Chest pain, unspecified: Secondary | ICD-10-CM | POA: Diagnosis not present

## 2015-09-15 DIAGNOSIS — R1900 Intra-abdominal and pelvic swelling, mass and lump, unspecified site: Secondary | ICD-10-CM

## 2015-09-15 DIAGNOSIS — N80129 Deep endometriosis of ovary, unspecified ovary: Secondary | ICD-10-CM

## 2015-09-15 DIAGNOSIS — R109 Unspecified abdominal pain: Secondary | ICD-10-CM | POA: Diagnosis present

## 2015-09-15 LAB — URINALYSIS, ROUTINE W REFLEX MICROSCOPIC
BILIRUBIN URINE: NEGATIVE
Glucose, UA: NEGATIVE mg/dL
KETONES UR: NEGATIVE mg/dL
Leukocytes, UA: NEGATIVE
Nitrite: NEGATIVE
PROTEIN: NEGATIVE mg/dL
Specific Gravity, Urine: 1.022 (ref 1.005–1.030)
pH: 7 (ref 5.0–8.0)

## 2015-09-15 LAB — CBC WITH DIFFERENTIAL/PLATELET
BASOS ABS: 0 10*3/uL (ref 0.0–0.1)
Basophils Relative: 0 %
Eosinophils Absolute: 0.2 10*3/uL (ref 0.0–0.7)
Eosinophils Relative: 4 %
HEMATOCRIT: 37.1 % (ref 36.0–46.0)
Hemoglobin: 12.2 g/dL (ref 12.0–15.0)
LYMPHS ABS: 2.1 10*3/uL (ref 0.7–4.0)
LYMPHS PCT: 43 %
MCH: 30 pg (ref 26.0–34.0)
MCHC: 32.9 g/dL (ref 30.0–36.0)
MCV: 91.2 fL (ref 78.0–100.0)
MONO ABS: 0.5 10*3/uL (ref 0.1–1.0)
MONOS PCT: 9 %
NEUTROS ABS: 2.2 10*3/uL (ref 1.7–7.7)
Neutrophils Relative %: 44 %
Platelets: 215 10*3/uL (ref 150–400)
RBC: 4.07 MIL/uL (ref 3.87–5.11)
RDW: 14.1 % (ref 11.5–15.5)
WBC: 4.9 10*3/uL (ref 4.0–10.5)

## 2015-09-15 LAB — I-STAT TROPONIN, ED: Troponin i, poc: 0 ng/mL (ref 0.00–0.08)

## 2015-09-15 LAB — BASIC METABOLIC PANEL
ANION GAP: 10 (ref 5–15)
BUN: 6 mg/dL (ref 6–20)
CALCIUM: 9.5 mg/dL (ref 8.9–10.3)
CO2: 24 mmol/L (ref 22–32)
Chloride: 106 mmol/L (ref 101–111)
Creatinine, Ser: 0.8 mg/dL (ref 0.44–1.00)
GFR calc Af Amer: >60 mL/min (ref >60)
Glucose, Bld: 88 mg/dL (ref 65–99)
Potassium: 4 mmol/L (ref 3.5–5.1)
Sodium: 140 mmol/L (ref 135–145)

## 2015-09-15 LAB — URINE MICROSCOPIC-ADD ON: WBC, UA: NONE SEEN WBC/hpf (ref 0–5)

## 2015-09-15 LAB — D-DIMER, QUANTITATIVE (NOT AT ARMC)

## 2015-09-15 LAB — I-STAT BETA HCG BLOOD, ED (MC, WL, AP ONLY)

## 2015-09-15 MED ORDER — KETOROLAC TROMETHAMINE 30 MG/ML IJ SOLN
15.0000 mg | Freq: Once | INTRAMUSCULAR | Status: AC
  Administered 2015-09-15: 15 mg via INTRAVENOUS
  Filled 2015-09-15: qty 1

## 2015-09-15 MED ORDER — OXYCODONE-ACETAMINOPHEN 5-325 MG PO TABS
ORAL_TABLET | ORAL | Status: AC
  Filled 2015-09-15: qty 1

## 2015-09-15 MED ORDER — HYDROMORPHONE HCL 1 MG/ML IJ SOLN
1.0000 mg | Freq: Once | INTRAMUSCULAR | Status: AC
Start: 2015-09-15 — End: 2015-09-15
  Administered 2015-09-15: 1 mg via INTRAVENOUS
  Filled 2015-09-15: qty 1

## 2015-09-15 MED ORDER — HYDROMORPHONE HCL 1 MG/ML IJ SOLN
1.0000 mg | Freq: Once | INTRAMUSCULAR | Status: AC
  Administered 2015-09-15: 1 mg via INTRAVENOUS
  Filled 2015-09-15: qty 1

## 2015-09-15 MED ORDER — OXYCODONE-ACETAMINOPHEN 5-325 MG PO TABS
1.0000 | ORAL_TABLET | ORAL | Status: DC | PRN
  Administered 2015-09-15: 1 via ORAL

## 2015-09-15 MED ORDER — ONDANSETRON HCL 4 MG/2ML IJ SOLN
4.0000 mg | Freq: Once | INTRAMUSCULAR | Status: AC
  Administered 2015-09-15: 4 mg via INTRAVENOUS
  Filled 2015-09-15: qty 2

## 2015-09-15 MED ORDER — LORAZEPAM 2 MG/ML IJ SOLN: 1.0000 mg | Freq: Once | INTRAMUSCULAR | Status: DC

## 2015-09-15 NOTE — ED Notes (Signed)
Pt states that the pain is not in her chest and she does not know why they put she was having chest pain. She states that she has been having left side pain the starts at her rib cage and goes start to her lower stomach.

## 2015-09-15 NOTE — ED Notes (Signed)
Pt c/o L rib cage onset today, pt denies injury to the area, pt c/o worsening pain with deep inspiration, pt c/o SOB, pt denies CP, n/v/d, pt A&O x4

## 2015-09-15 NOTE — ED Provider Notes (Signed)
Patient care initially provided by Dr. Radford PaxBeaton and signed out at end of shift. Presents with onset today of pain in left flank, progressively worsening. Began to radiate into LLQ Some worse with breathing.  No N, V, fever D-dimer, negative Troponin negative, EKG, labs normal  Some hematuria without infection Pending CT Renal study to r/o stones, pain control.   Plan: re-evaluate pain control, results of CT.  CT negative for kidney stone. Does show a pelvic mass, ?fibroid but not clear. Recommend Pelvic US.  Pelvic US findings c/w endometrioma. Patient and husband updated on results. Pain has been managed in ED until time of discharge when she reports it is returning. Additional pain medication provided. She is stable for discharge. She states she has GYN care available in outpatient setting and is encouraged to follow up for further management.   Kimberly AnisShari Azayla Polo, PA-C 09/16/15 2131  Nelva Nayobert Beaton, MD 09/24/15 [phone]

## 2015-09-15 NOTE — ED Provider Notes (Signed)
CSN: [id number]     Arrival date & time 09/15/15  1350 History   First MD Initiated Contact with Patient 09/15/15 1850     Chief Complaint  Patient presents with  . Flank Pain      HPI  Expand All Collapse All   Pt states that started having pain  in her left chest/flank and she does not know why they put she was having chest pain. She states that she has been having left side pain the starts at her rib cage and goes start to her lower stomach.  Started this moring and now has gotten severe.  No cardiac hx.        Past Medical History  Diagnosis Date  . Chiari malformation   . Migraine    Past Surgical History  Procedure Laterality Date  . Cholecystectomy    . Tubal ligation    . Scleral buckling     No family history on file. Social History  Substance Use Topics  . Smoking status: Never Smoker   . Smokeless tobacco: None  . Alcohol Use: No   OB History    No data available     Review of Systems  Constitutional: Negative for fever and chills.  Cardiovascular: Negative for palpitations and leg swelling.  Genitourinary: Positive for flank pain.  All other systems reviewed and are negative.     Allergies  Review of patient's allergies indicates no known allergies.  Home Medications   Prior to Admission medications   Medication Sig Start Date End Date Taking? Authorizing Provider  amphetamine-dextroamphetamine (ADDERALL) 20 MG tablet Take 20 mg by mouth 4 (four) times daily.    Yes Historical Provider, MD  HYDROcodone-acetaminophen (NORCO) 5-325 MG per tablet Take 1 tablet by mouth every 6 (six) hours as needed. For pain.    Yes Historical Provider, MD  promethazine (PHENERGAN) 25 MG tablet Take 1 tablet (25 mg total) by mouth every 6 (six) hours as needed for nausea. 11/30/11 12/07/11  Eber Hong, MD   BP 122/76 mmHg  Pulse 65  Temp(Src) 98.2 F (36.8 C) (Oral)  Resp 16  Ht  (1.651 m)  SpO2 98%  LMP 09/05/2015 Physical Exam  Constitutional: She is  oriented to person, place, and time. She appears well-developed and well-nourished. No distress.  HENT:  Head: Normocephalic and atraumatic.  Eyes: Pupils are equal, round, and reactive to light.  Neck: Normal range of motion.  Cardiovascular: Normal rate and intact distal pulses.   Pulmonary/Chest: No respiratory distress.   She exhibits tenderness.    Abdominal: Normal appearance. She exhibits no distension. There is no tenderness. There is no rebound and no guarding.    Musculoskeletal: Normal range of motion.  Neurological: She is alert and oriented to person, place, and time. No cranial nerve deficit.  Skin: Skin is warm and dry. No rash noted.  Psychiatric: She has a normal mood and affect. Her behavior is normal.  Nursing note and vitals reviewed.   ED Course  Procedures (including critical care time) Labs Review Results for orders placed or performed during the hospital encounter of 09/15/15  CBC with Differential/Platelet  Result Value Ref Range   WBC 4.9 4.0 - 10.5 K/uL   RBC 4.07 3.87 - 5.11 MIL/uL   Hemoglobin 12.2 12.0 - 15.0 g/dL   HCT 40.9 81.1 - 91.4 %   MCV 91.2 78.0 - 100.0 fL   MCH 30.0 26.0 - 34.0 pg   MCHC 32.9 30.0 - 36.0  g/dL   RDW 11.9 14.7 - 82.9 %   Platelets 215 150 - 400 K/uL   Neutrophils Relative % 44 %   Neutro Abs 2.2 1.7 - 7.7 K/uL   Lymphocytes Relative 43 %   Lymphs Abs 2.1 0.7 - 4.0 K/uL   Monocytes Relative 9 %   Monocytes Absolute 0.5 0.1 - 1.0 K/uL   Eosinophils Relative 4 %   Eosinophils Absolute 0.2 0.0 - 0.7 K/uL   Basophils Relative 0 %   Basophils Absolute 0.0 0.0 - 0.1 K/uL  Basic metabolic panel  Result Value Ref Range   Sodium 140 135 - 145 mmol/L   Potassium 4.0 3.5 - 5.1 mmol/L   Chloride 106 101 - 111 mmol/L   CO2 24 22 - 32 mmol/L   Glucose, Bld 88 65 - 99 mg/dL   BUN 6 6 - 20 mg/dL   Creatinine, Ser 5.62 0.44 - 1.00 mg/dL   Calcium 9.5 8.9 - 13.0 mg/dL   GFR calc non Af Amer >60 >60 mL/min   GFR calc Af Amer  >60 >60 mL/min   Anion gap 10 5 - 15  Urinalysis, Routine w reflex microscopic (not at [facility])  Result Value Ref Range   Color, Urine YELLOW YELLOW   APPearance CLOUDY (A) CLEAR   Specific Gravity, Urine 1.022 1.005 - 1.030   pH 7.0 5.0 - 8.0   Glucose, UA NEGATIVE NEGATIVE mg/dL   Hgb urine dipstick SMALL (A) NEGATIVE   Bilirubin Urine NEGATIVE NEGATIVE   Ketones, ur NEGATIVE NEGATIVE mg/dL   Protein, ur NEGATIVE NEGATIVE mg/dL   Nitrite NEGATIVE NEGATIVE   Leukocytes, UA NEGATIVE NEGATIVE  D-dimer, quantitative (not at [facility])  Result Value Ref Range   D-Dimer, Quant <0.27 0.00 - 0.50 ug/mL-FEU  Urine microscopic-add on  Result Value Ref Range   Squamous Epithelial / LPF 6-30 (A) NONE SEEN   WBC, UA NONE SEEN 0 - 5 WBC/hpf   RBC / HPF 6-30 0 - 5 RBC/hpf   Bacteria, UA RARE (A) NONE SEEN   Urine-Other MUCOUS PRESENT   I-Stat Beta hCG blood, ED ([facility], [facility], [facility] only)  Result Value Ref Range   I-stat hCG, quantitative <5.0 <5 mIU/mL   Comment 3          I-stat troponin, ED  Result Value Ref Range   Troponin i, poc 0.00 0.00 - 0.08 ng/mL   Comment 3           Dg Chest 2 View  09/15/2015  CLINICAL DATA:  Acute left-sided chest pain. EXAM: CHEST  2 VIEW COMPARISON:  May 05, 2011. FINDINGS: The heart size and mediastinal contours are within normal limits. Both lungs are clear. No pneumothorax or pleural effusion is noted. The visualized skeletal structures are unremarkable. IMPRESSION: No active cardiopulmonary disease. Electronically Signed   By: Lupita Raider, M.D.   On: 09/15/2015 14:53      Imaging Review Dg Chest 2 View  09/15/2015  CLINICAL DATA:  Acute left-sided chest pain. EXAM: CHEST  2 VIEW COMPARISON:  May 05, 2011. FINDINGS: The heart size and mediastinal contours are within normal limits. Both lungs are clear. No pneumothorax or pleural effusion is noted. The visualized skeletal structures are unremarkable. IMPRESSION: No active cardiopulmonary disease.  Electronically Signed   By: Lupita Raider, M.D.   On: 09/15/2015 14:53   I have personally reviewed and evaluated these images and lab results as part of my medical decision-making.   EKG Interpretation  Date/Time:  Thursday September 15 2015 14:29:15 EDT Ventricular Rate:  87 PR Interval:  120 QRS Duration: 68 QT Interval:  368 QTC Calculation: 442 R Axis:   83 Text Interpretation:  Normal sinus rhythm Normal ECG Confirmed by Jaiona Simien   MD, Salil Raineri ([id number]) on 09/15/2015 7:49:15 PM     Awaiting CT scan results.  Will be followed by Levander CampionSheri Upstill PA and Dr. Ethelle Lyondi whom I discussed the case with.   MDM   Final diagnoses:  None        Nelva Nayobert Chidubem Chaires, MD 09/16/15 [phone]

## 2015-09-16 ENCOUNTER — Emergency Department (HOSPITAL_COMMUNITY): Payer: 59

## 2015-09-16 MED ORDER — HYDROCODONE-ACETAMINOPHEN 5-325 MG PO TABS: 1.0000 | ORAL_TABLET | ORAL | Status: DC | PRN

## 2015-09-16 MED ORDER — ONDANSETRON HCL 4 MG/2ML IJ SOLN
4.0000 mg | Freq: Once | INTRAMUSCULAR | Status: AC
  Administered 2015-09-16: 4 mg via INTRAVENOUS
  Filled 2015-09-16: qty 2

## 2015-09-16 MED ORDER — NAPROXEN 500 MG PO TABS: 500.0000 mg | ORAL_TABLET | Freq: Two times a day (BID) | ORAL | Status: DC

## 2015-09-16 MED ORDER — HYDROMORPHONE HCL 1 MG/ML IJ SOLN
1.0000 mg | Freq: Once | INTRAMUSCULAR | Status: AC
  Administered 2015-09-16: 1 mg via INTRAVENOUS
  Filled 2015-09-16: qty 1

## 2015-09-16 NOTE — Discharge Instructions (Signed)
Endometriosis Endometriosis is a condition in which the tissue that lines the uterus (endometrium) grows outside of its normal location. The tissue may grow in many locations close to the uterus, but it commonly grows on the ovaries, fallopian tubes, vagina, or bowel. Because the uterus expels, or sheds, its lining every menstrual cycle, there is bleeding wherever the endometrial tissue is located. This can cause pain because blood is irritating to tissues not normally exposed to it.  CAUSES  The cause of endometriosis is not known.  SIGNS AND SYMPTOMS  Often, there are no symptoms. When symptoms are present, they can vary with the location of the displaced tissue. Various symptoms can occur at different times. Although symptoms occur mainly during a woman's menstrual period, they can also occur midcycle and usually stop with menopause. Some people may go months with no symptoms at all. Symptoms may include:   Back or abdominal pain.   Heavier bleeding during periods.   Pain during intercourse.   Painful bowel movements.   Infertility. DIAGNOSIS  Your health care provider will do a physical exam and ask about your symptoms. Various tests may be done, such as:   Blood tests and urine tests. These are done to help rule out other problems.   Ultrasound. This test is done to look for abnormal tissue.   An X-ray of the lower bowel (barium enema).  Laparoscopy. In this procedure, a thin, lighted tube with a tiny camera on the end (laparoscope) is inserted into your abdomen. This helps your health care provider look for abnormal tissue to confirm the diagnosis. The health care provider may also remove a small piece of tissue (biopsy) from any abnormal tissue found. This tissue sample can then be sent to a lab so it can be looked at under a microscope. TREATMENT  Treatment will vary and may include:   Medicines to relieve pain. Nonsteroidal anti-inflammatory drugs (NSAIDs) are a type of  pain medicine that can help to relieve the pain caused by endometriosis.  Hormonal therapy. When using hormonal therapy, periods are eliminated. This eliminates the monthly exposure to blood by the displaced endometrial tissue.   Surgery. Surgery may sometimes be done to remove the abnormal endometrial tissue. In severe cases, surgery may be done to remove the fallopian tubes, uterus, and ovaries (hysterectomy). HOME CARE INSTRUCTIONS   Take all medicines as directed by your health care provider. Do not take aspirin because it may increase bleeding when you are not on hormonal therapy.   Avoid activities that produce pain, including sexual activity. SEEK MEDICAL CARE IF:  You have pelvic pain before, after, or during your periods.  You have pelvic pain between periods that gets worse during your period.  You have pelvic pain during or after sex.  You have pelvic pain with bowel movements or urination, especially during your period.  You have problems getting pregnant.  You have a fever. SEEK IMMEDIATE MEDICAL CARE IF:   Your pain is severe and is not responding to pain medicine.   You have severe nausea and vomiting, or you cannot keep foods down.   You have pain that is limited to the right lower part of your abdomen.   You have swelling or increasing pain in your abdomen.   You see blood in your stool.  MAKE SURE YOU:   Understand these instructions.  Will watch your condition.  Will get help right away if you are not doing well or get worse.   This information   is not intended to replace advice given to you by your health care provider. Make sure you discuss any questions you have with your health care provider.   Document Released: 05/25/2000 Document Revised: 06/18/2014 Document Reviewed: 01/23/2013 Elsevier Interactive Patient Education 2016 [employer]  

## 2016-07-10 ENCOUNTER — Ambulatory Visit: Payer: 59 | Admitting: Diagnostic Neuroimaging

## 2016-07-11 ENCOUNTER — Encounter: Payer: Self-pay | Admitting: Diagnostic Neuroimaging

## 2017-04-03 IMAGING — CT CT RENAL STONE PROTOCOL
2 of 4 series · 16 of 46 positions shown, 18 images · non-contrast
Comparison: None.

CLINICAL DATA: Left flank pain, onset this morning.  Nausea.

EXAM:
CT ABDOMEN AND PELVIS WITHOUT CONTRAST
TECHNIQUE: Multidetector CT imaging of the abdomen and pelvis was performed
following the standard protocol without IV contrast.

[Series 2: renal stone 5mm · axial · 0.69mm/px · z∈[+998,+1408]mm · 13 of 90 slices shown, 15 images]
[im 4/90  soft-tissue]
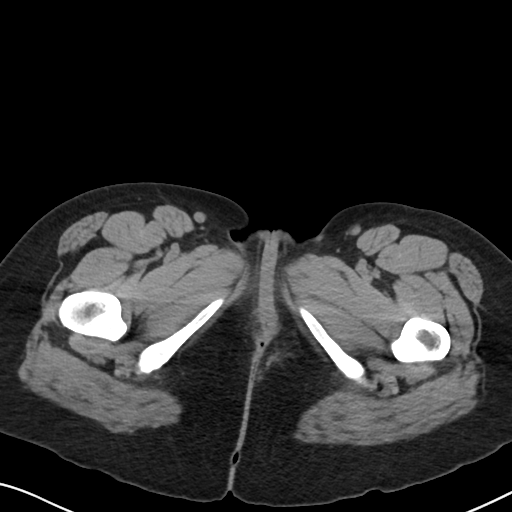
[im 4/90  bone]
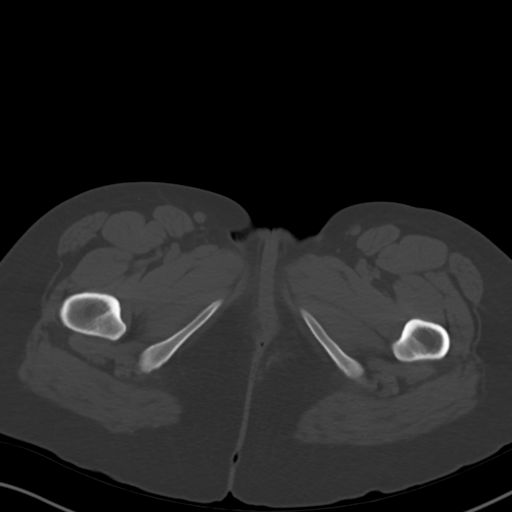
[im 12/90  soft-tissue]
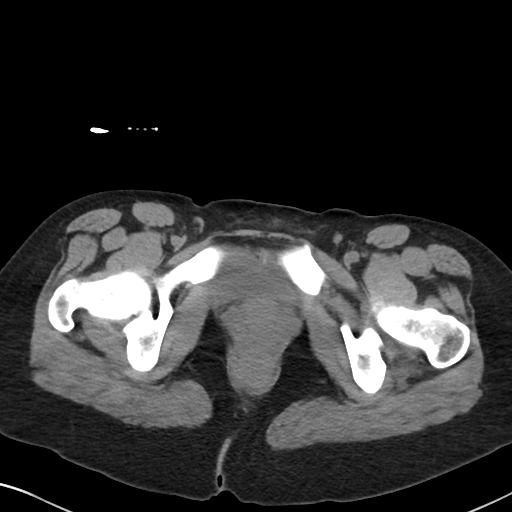
[im 19/90  soft-tissue]
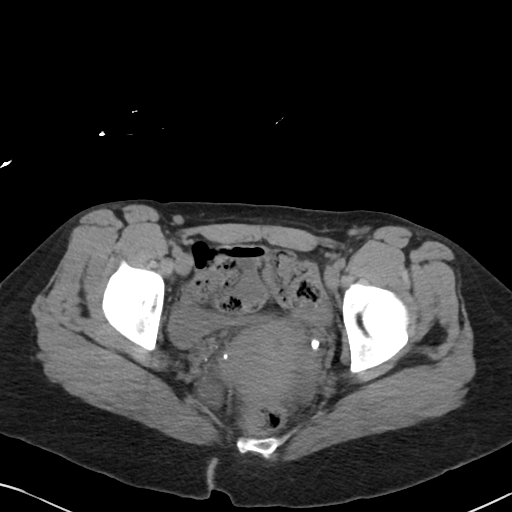
[im 26/90  soft-tissue]
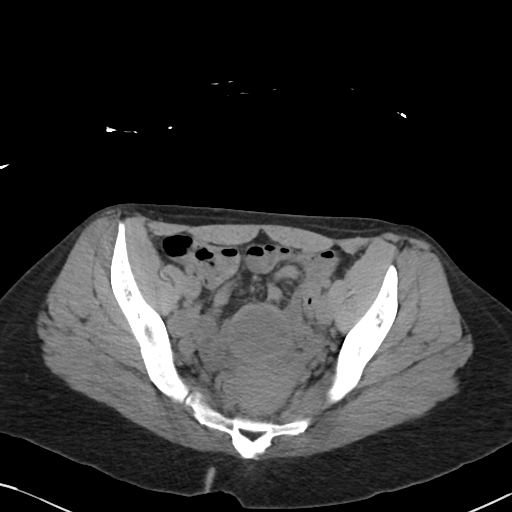
[im 30/90  soft-tissue]
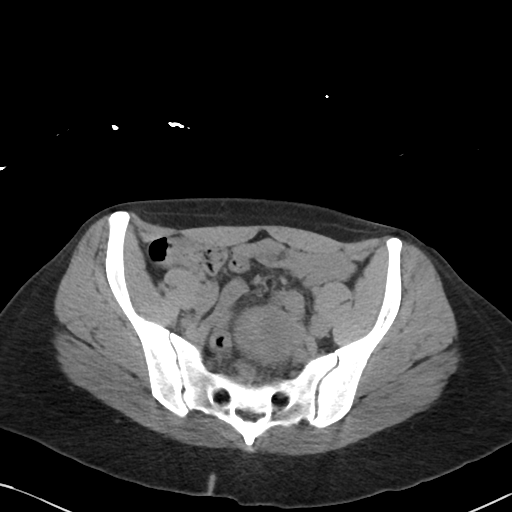
[im 38/90  soft-tissue]
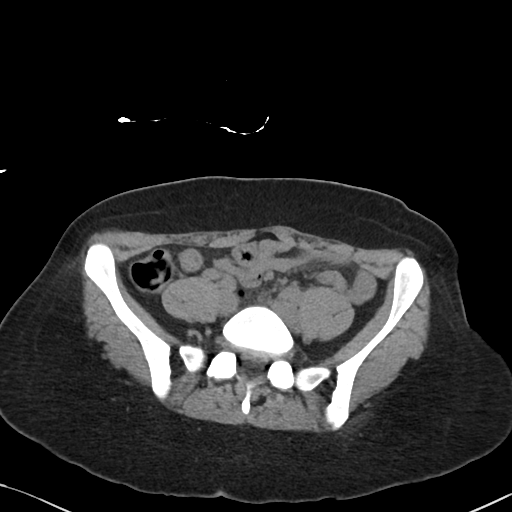
[im 45/90  soft-tissue]
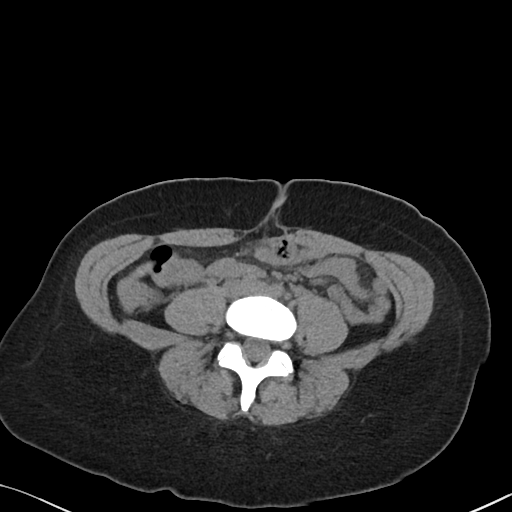
[im 52/90  soft-tissue]
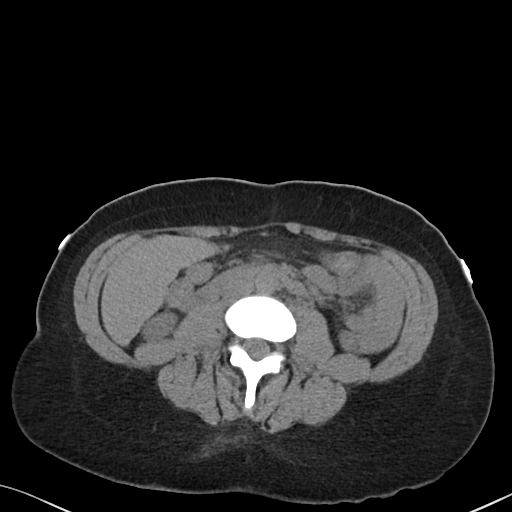
[im 60/90  soft-tissue]
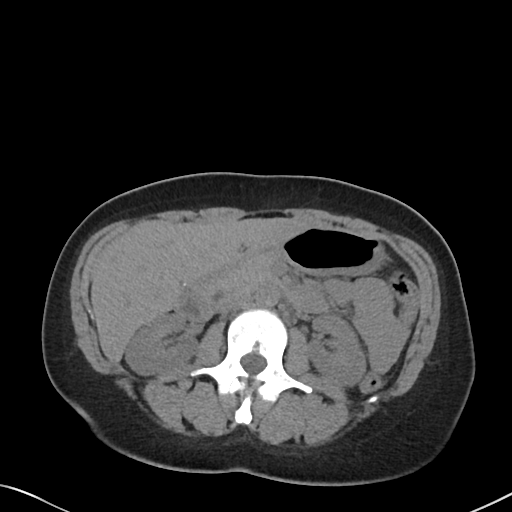
[im 60/90  bone]
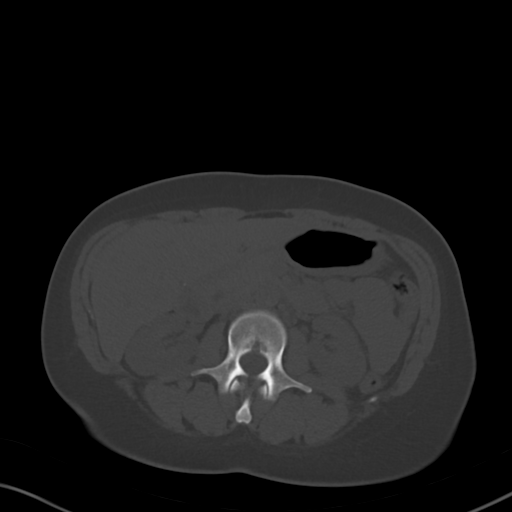
[im 64/90  soft-tissue]
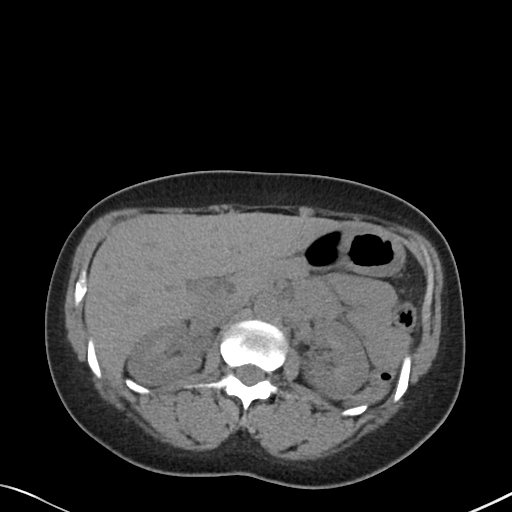
[im 71/90  soft-tissue]
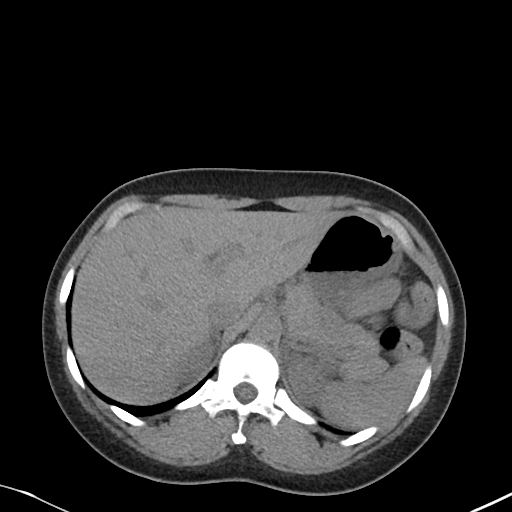
[im 78/90  soft-tissue]
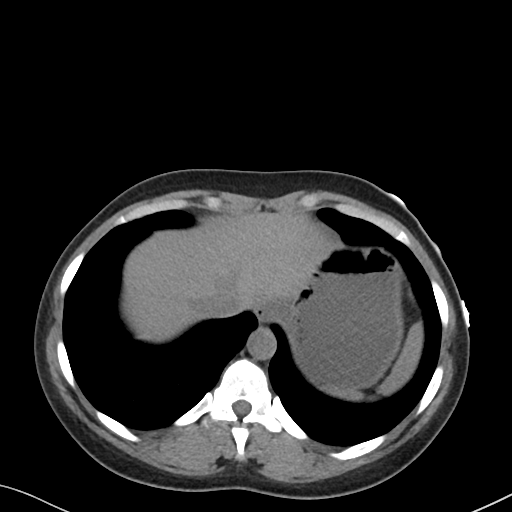
[im 86/90  soft-tissue]
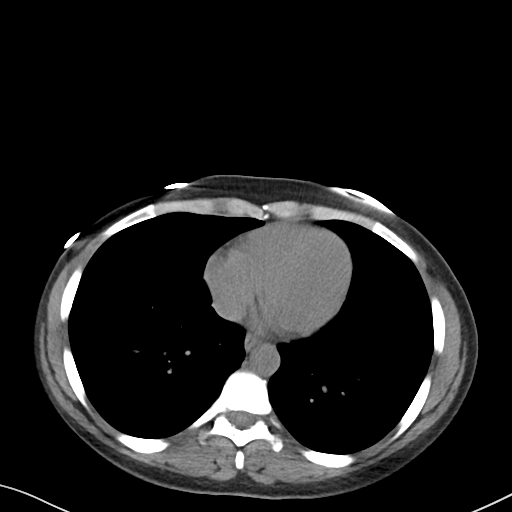

[Series 5: renal stone 3.0 cor · coronal · 0.57mm/px · 3 of 71 slices shown]
[im 24/71  soft-tissue]
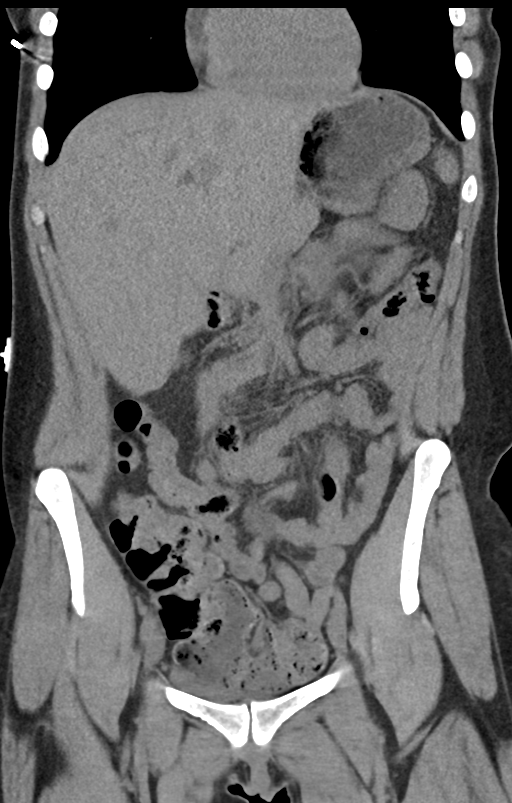
[im 32/71  soft-tissue]
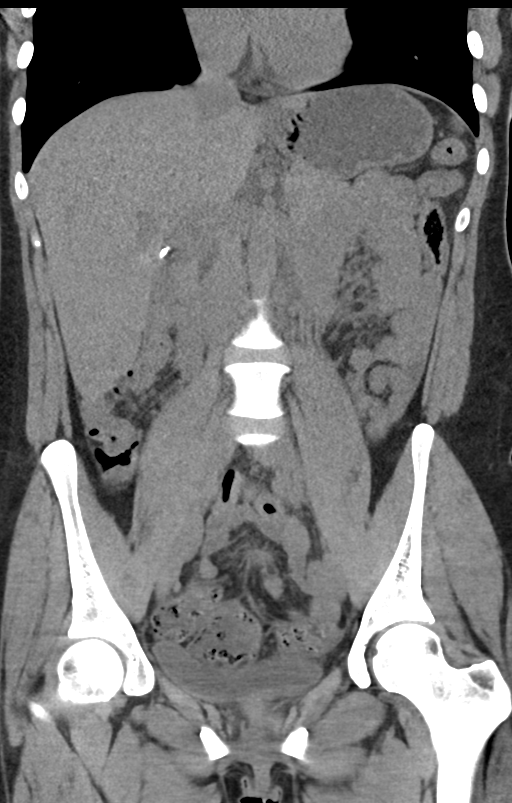
[im 39/71  soft-tissue]
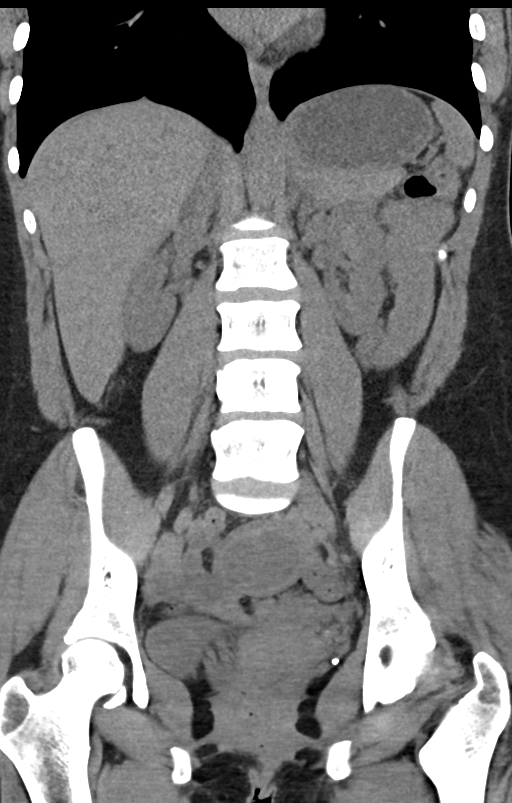

[16 of 46 positions shown; findings below may reference images not displayed]

FINDINGS: There are unremarkable unenhanced appearances of the liver and bile
ducts. There is prior cholecystectomy.

There are unremarkable unenhanced appearances of the pancreas,
spleen, adrenals and kidneys.

Stomach, small bowel and colon appear unremarkable.

The abdominal aorta is normal in caliber. There is no
atherosclerotic calcification. There is no adenopathy in the abdomen
or pelvis.

There is a 5 cm spherical mass in the pelvic midline. This may be a
fibroid but it is not conclusively shown to originate from the
uterus.

No acute inflammatory changes are evident in the abdomen or pelvis.
There is no ascites.

There is no significant abnormality in the lower chest.
IMPRESSION: 1. 5cm mass in the pelvic midline, most likely a fibroid but it
cannot be characterized and is not conclusively shown to originate
from the uterus. It could instead represent an adnexal lesion.
Recommend pelvic sonography.
2. No acute findings are evident in the abdomen or pelvis.

## 2021-08-03 DIAGNOSIS — J0100 Acute maxillary sinusitis, unspecified: Secondary | ICD-10-CM | POA: Diagnosis not present

## 2021-08-22 DIAGNOSIS — F900 Attention-deficit hyperactivity disorder, predominantly inattentive type: Secondary | ICD-10-CM | POA: Diagnosis not present

## 2021-08-22 DIAGNOSIS — F410 Panic disorder [episodic paroxysmal anxiety] without agoraphobia: Secondary | ICD-10-CM | POA: Diagnosis not present

## 2021-08-22 DIAGNOSIS — F3342 Major depressive disorder, recurrent, in full remission: Secondary | ICD-10-CM | POA: Diagnosis not present

## 2022-08-10 DIAGNOSIS — F410 Panic disorder [episodic paroxysmal anxiety] without agoraphobia: Secondary | ICD-10-CM | POA: Diagnosis not present

## 2022-08-10 DIAGNOSIS — F332 Major depressive disorder, recurrent severe without psychotic features: Secondary | ICD-10-CM | POA: Diagnosis not present

## 2022-08-10 DIAGNOSIS — F900 Attention-deficit hyperactivity disorder, predominantly inattentive type: Secondary | ICD-10-CM | POA: Diagnosis not present

## 2022-09-03 ENCOUNTER — Ambulatory Visit (INDEPENDENT_AMBULATORY_CARE_PROVIDER_SITE_OTHER): Payer: 59 | Admitting: Family Medicine

## 2022-09-03 ENCOUNTER — Encounter: Payer: Self-pay | Admitting: Family Medicine

## 2022-09-03 VITALS — BP 105/72 | HR 70 | Ht 65.0 in | Wt 173.0 lb

## 2022-09-03 DIAGNOSIS — Z7689 Persons encountering health services in other specified circumstances: Secondary | ICD-10-CM

## 2022-09-03 DIAGNOSIS — M2141 Flat foot [pes planus] (acquired), right foot: Secondary | ICD-10-CM

## 2022-09-03 DIAGNOSIS — E663 Overweight: Secondary | ICD-10-CM | POA: Diagnosis not present

## 2022-09-03 DIAGNOSIS — Z8669 Personal history of other diseases of the nervous system and sense organs: Secondary | ICD-10-CM | POA: Insufficient documentation

## 2022-09-03 DIAGNOSIS — Z87891 Personal history of nicotine dependence: Secondary | ICD-10-CM

## 2022-09-03 DIAGNOSIS — L97509 Non-pressure chronic ulcer of other part of unspecified foot with unspecified severity: Secondary | ICD-10-CM | POA: Insufficient documentation

## 2022-09-03 DIAGNOSIS — F988 Other specified behavioral and emotional disorders with onset usually occurring in childhood and adolescence: Secondary | ICD-10-CM

## 2022-09-03 DIAGNOSIS — D1724 Benign lipomatous neoplasm of skin and subcutaneous tissue of left leg: Secondary | ICD-10-CM

## 2022-09-03 DIAGNOSIS — L97519 Non-pressure chronic ulcer of other part of right foot with unspecified severity: Secondary | ICD-10-CM

## 2022-09-03 DIAGNOSIS — L84 Corns and callosities: Secondary | ICD-10-CM | POA: Insufficient documentation

## 2022-09-03 DIAGNOSIS — F339 Major depressive disorder, recurrent, unspecified: Secondary | ICD-10-CM

## 2022-09-03 DIAGNOSIS — Z1231 Encounter for screening mammogram for malignant neoplasm of breast: Secondary | ICD-10-CM

## 2022-09-03 DIAGNOSIS — D179 Benign lipomatous neoplasm, unspecified: Secondary | ICD-10-CM | POA: Insufficient documentation

## 2022-09-03 DIAGNOSIS — F329 Major depressive disorder, single episode, unspecified: Secondary | ICD-10-CM | POA: Insufficient documentation

## 2022-09-03 DIAGNOSIS — L97529 Non-pressure chronic ulcer of other part of left foot with unspecified severity: Secondary | ICD-10-CM

## 2022-09-03 DIAGNOSIS — D2121 Benign neoplasm of connective and other soft tissue of right lower limb, including hip: Secondary | ICD-10-CM | POA: Insufficient documentation

## 2022-09-03 NOTE — Assessment & Plan Note (Signed)
Referred to podiatry.

## 2022-09-03 NOTE — Patient Instructions (Signed)
It was nice to meet you today,    You will need to schedule a followup appointment for your pap smear.  You will need to come in another day for your labs as our lab is closed for the day.    I will put in a referral for a mammogram and a referral to the podiatrist.  Please let us know if no one has contacted you in a week regarding these.    Have a great day,   Dr. Jeannine Kitten

## 2022-09-03 NOTE — Assessment & Plan Note (Signed)
A1c and lipid profile

## 2022-09-03 NOTE — Progress Notes (Signed)
New Patient Office Visit  Subjective    Patient ID: Kimberly Snyder, female    DOB: Feb 14, 1981  Age: 42 y.o. MRN: [id number]  CC:  Chief Complaint  Patient presents with   Establish Care   sores on feet    HPI Kimberly Snyder presents to establish care.  She was previously going to [facility] with Dr. Jani Gravel before he retired.  She also sees a psychiatrist named Dr. Berenda Morale. That is who prescribes her Trintellix, Xanax and Adderall.  She does not take any other medications.  Her eye doctor is Dr. Roderic Palau.   Has not seen a PCP in several years.  Has not had a mammogram ever has not had a Pap smear in several years.  Is willing to do both here.  Main complaints are pain in her feet.  She has a known fibroma on the left foot and a fallen arch on the right foot but she states over the last month or so she has had ulcerations that will not heal on the bottom of both feet.  She has tried creams pads and other treatments without improvement.  PMH: migraines, mdd, adhd,  PSH: macular hole surgery.    FH: GF - throat cancer.  GM - heart disaease. Mother - diabetes.    Tobacco use: former. Quit 2001.  Started at 89.  Alcohol use: 2xweek Drug use: none.   Marital status: married. Lives with husband.   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  [name].  Has an IUD.      Outpatient Encounter Medications as of 09/03/2022  Medication Sig   ALPRAZolam (XANAX) 1 MG tablet Take 1 mg by mouth 3 (three) times daily.   amphetamine-dextroamphetamine (ADDERALL) 20 MG tablet Take 20 mg by mouth 4 (four) times daily.    vortioxetine HBr (TRINTELLIX) 10 MG TABS tablet Take 10 mg by mouth daily.   HYDROcodone-acetaminophen (NORCO/VICODIN) 5-325 MG tablet Take 1-2 tablets by mouth every 4 (four) hours as needed. (Patient not taking: Reported on 09/03/2022)   naproxen (NAPROSYN) 500 MG tablet Take 1 tablet (500 mg total) by mouth 2 (two) times daily. (Patient not taking: Reported on 09/03/2022)    promethazine (PHENERGAN) 25 MG tablet Take 1 tablet (25 mg total) by mouth every 6 (six) hours as needed for nausea.   No facility-administered encounter medications on file as of 09/03/2022.    Past Medical History:  Diagnosis Date   Anxiety    Chiari malformation    Depression    Migraine     Past Surgical History:  Procedure Laterality Date   CHOLECYSTECTOMY     scleral buckling     TUBAL LIGATION      History reviewed. No pertinent family history.  Social History   Socioeconomic History   Marital status: Married    Spouse name: Not on file   Number of children: Not on file   Years of education: Not on file   Highest education level: Not on file  Occupational History   Not on file  Tobacco Use   Smoking status: Never   Smokeless tobacco: Not on file  Vaping Use   Vaping Use: Never used  Substance and Sexual Activity   Alcohol use: Yes    Alcohol/week: 2.0 standard drinks of alcohol    Types: 2 Glasses of wine per week   Drug use: Not Currently    Types: Marijuana   Sexual activity: Yes    Birth control/protection: I.U.D.  Other Topics Concern   Not on file  Social History Narrative   Not on file   Social Determinants of Health   Financial Resource Strain: Not on file  Food Insecurity: Not on file  Transportation Needs: Not on file  Physical Activity: Not on file  Stress: Not on file  Social Connections: Not on file  Intimate Partner Violence: Not on file    ROS      Objective    BP 105/72   Pulse 70   Ht 5\' 5"  (1.651 m)   Wt 173 lb (78.5 kg)   SpO2 100% Comment: on RA  BMI 28.79 kg/m   Physical Exam General: Alert and oriented.  No acute distress HEENT: PERRLA, EOMI CV: Regular rate and rhythm Pulmonary: Lungs clear bilaterally GI: Soft, nontender. Extremities: Fibroma on the sole of the left foot.  Pes planus on the right foot singular nonbleeding skin ulcer of the mid sole bilaterally.      Assessment & Plan:   Problem List  Items Addressed This Visit       Other   ADD (attention deficit disorder)   Fibroma of foot, right   Foot ulceration ([place])    Referred to podiatry      Relevant Orders   Ambulatory referral to Podiatry   Former smoker   History of migraine   Lipoma   Major depressive disorder   Relevant Medications   ALPRAZolam (XANAX) 1 MG tablet   vortioxetine HBr (TRINTELLIX) 10 MG TABS tablet   Overweight    A1c and lipid profile      Relevant Orders   Lipid Profile   HgB A1c   Pes planus of right foot   Other Visit Diagnoses     Encounter to establish care    -  Primary   Encounter for screening mammogram for malignant neoplasm of breast       Relevant Orders   MM 3D SCREENING MAMMOGRAM BILATERAL BREAST       Return in about 4 weeks (around 10/01/2022) for pap smear, f/u on labs.  also needs to come back for lab visit. Benay Pike, MD

## 2022-09-06 ENCOUNTER — Other Ambulatory Visit: Payer: BC Managed Care – PPO

## 2022-09-11 ENCOUNTER — Telehealth: Payer: Self-pay | Admitting: *Deleted

## 2022-09-11 NOTE — Telephone Encounter (Signed)
LVM for pt to call office, looks like she had a podiatry referral and they have tried to call her to schedule.  I tried to reach her and the number in her chart is [phone].  When I called the person told me I had the wrong number so I am removing that number from her chart.

## 2022-09-13 ENCOUNTER — Other Ambulatory Visit: Payer: BC Managed Care – PPO

## 2022-09-13 DIAGNOSIS — E663 Overweight: Secondary | ICD-10-CM

## 2022-09-13 NOTE — Telephone Encounter (Signed)
Spoke to pt in office today and she said that they were able to reach her. Therese Rocco Zimmerman Rumple, CMA

## 2022-09-14 LAB — HEMOGLOBIN A1C
Est. average glucose Bld gHb Est-mCnc: 111 mg/dL
Hgb A1c MFr Bld: 5.5 % (ref 4.8–5.6)

## 2022-09-14 LAB — LIPID PANEL
Chol/HDL Ratio: 2.3 ratio (ref 0.0–4.4)
Cholesterol, Total: 161 mg/dL (ref 100–199)
HDL: 70 mg/dL (ref >39)
LDL Chol Calc (NIH): 80 mg/dL (ref 0–99)
Triglycerides: 50 mg/dL (ref 0–149)
VLDL Cholesterol Cal: 11 mg/dL (ref 5–40)

## 2022-09-17 ENCOUNTER — Telehealth: Payer: Self-pay | Admitting: *Deleted

## 2022-09-17 NOTE — Telephone Encounter (Signed)
Pt came in office and results from labs was given to her.

## 2022-09-26 ENCOUNTER — Ambulatory Visit (INDEPENDENT_AMBULATORY_CARE_PROVIDER_SITE_OTHER): Payer: BC Managed Care – PPO

## 2022-09-26 ENCOUNTER — Ambulatory Visit (INDEPENDENT_AMBULATORY_CARE_PROVIDER_SITE_OTHER): Payer: BC Managed Care – PPO | Admitting: Podiatry

## 2022-09-26 ENCOUNTER — Encounter: Payer: Self-pay | Admitting: Podiatry

## 2022-09-26 ENCOUNTER — Other Ambulatory Visit: Payer: Self-pay | Admitting: Podiatry

## 2022-09-26 DIAGNOSIS — E7800 Pure hypercholesterolemia, unspecified: Secondary | ICD-10-CM | POA: Insufficient documentation

## 2022-09-26 DIAGNOSIS — M722 Plantar fascial fibromatosis: Secondary | ICD-10-CM

## 2022-09-26 DIAGNOSIS — R61 Generalized hyperhidrosis: Secondary | ICD-10-CM | POA: Insufficient documentation

## 2022-09-26 DIAGNOSIS — Q666 Other congenital valgus deformities of feet: Secondary | ICD-10-CM

## 2022-09-26 DIAGNOSIS — G43909 Migraine, unspecified, not intractable, without status migrainosus: Secondary | ICD-10-CM | POA: Insufficient documentation

## 2022-09-26 DIAGNOSIS — M542 Cervicalgia: Secondary | ICD-10-CM | POA: Insufficient documentation

## 2022-09-26 DIAGNOSIS — R21 Rash and other nonspecific skin eruption: Secondary | ICD-10-CM | POA: Insufficient documentation

## 2022-09-26 DIAGNOSIS — M79602 Pain in left arm: Secondary | ICD-10-CM | POA: Insufficient documentation

## 2022-09-26 DIAGNOSIS — M1990 Unspecified osteoarthritis, unspecified site: Secondary | ICD-10-CM | POA: Insufficient documentation

## 2022-09-26 DIAGNOSIS — H5711 Ocular pain, right eye: Secondary | ICD-10-CM | POA: Insufficient documentation

## 2022-09-26 DIAGNOSIS — I10 Essential (primary) hypertension: Secondary | ICD-10-CM | POA: Insufficient documentation

## 2022-09-26 DIAGNOSIS — F419 Anxiety disorder, unspecified: Secondary | ICD-10-CM | POA: Insufficient documentation

## 2022-09-26 DIAGNOSIS — Z0000 Encounter for general adult medical examination without abnormal findings: Secondary | ICD-10-CM | POA: Insufficient documentation

## 2022-09-26 DIAGNOSIS — R202 Paresthesia of skin: Secondary | ICD-10-CM | POA: Insufficient documentation

## 2022-09-26 DIAGNOSIS — M79672 Pain in left foot: Secondary | ICD-10-CM | POA: Insufficient documentation

## 2022-09-26 DIAGNOSIS — E039 Hypothyroidism, unspecified: Secondary | ICD-10-CM | POA: Insufficient documentation

## 2022-09-26 DIAGNOSIS — M25512 Pain in left shoulder: Secondary | ICD-10-CM | POA: Insufficient documentation

## 2022-09-26 NOTE — Progress Notes (Signed)
Subjective:  Patient ID: Kimberly Snyder, female    DOB: 1981-06-08,  MRN: [id number]  Chief Complaint  Patient presents with   Callouses    Sore places on bottom of both feet     42 y.o. female presents with the above complaint.  Patient presents with bilateral flatfoot deformity.  Patient states that she is has a new job that requires standing a lot on her foot.  She also has a fibroma on the left side.  She was known to Dr. Deirdre Priest at the [facility] but he is here for further management.  She has tried all conservative care including cam boot immobilization injection to the fibroma she would like to discuss surgical excision of fibromyalgia caused her constant pain.  She had previous orthotics made but were lost.  She would like to do another pair of orthotics.   Review of Systems: Negative except as noted in the HPI. Denies N/V/F/Ch.  Past Medical History:  Diagnosis Date   Anxiety    Chiari malformation    Depression    Migraine     Current Outpatient Medications:    ALPRAZolam (XANAX) 1 MG tablet, Take 1 mg by mouth 3 (three) times daily., Disp: , Rfl:    amphetamine-dextroamphetamine (ADDERALL) 20 MG tablet, Take 20 mg by mouth 4 (four) times daily. , Disp: , Rfl:    HYDROcodone-acetaminophen (NORCO/VICODIN) 5-325 MG tablet, Take 1-2 tablets by mouth every 4 (four) hours as needed. (Patient not taking: Reported on 09/03/2022), Disp: 12 tablet, Rfl: 0   naproxen (NAPROSYN) 500 MG tablet, Take 1 tablet (500 mg total) by mouth 2 (two) times daily. (Patient not taking: Reported on 09/03/2022), Disp: 30 tablet, Rfl: 0   promethazine (PHENERGAN) 25 MG tablet, Take 1 tablet (25 mg total) by mouth every 6 (six) hours as needed for nausea., Disp: 12 tablet, Rfl: 0   vortioxetine HBr (TRINTELLIX) 10 MG TABS tablet, Take 10 mg by mouth daily., Disp: , Rfl:   Social History   Tobacco Use  Smoking Status Never  Smokeless Tobacco Not on file    Allergies  Allergen Reactions    Wasp Venom Itching   Objective:  There were no vitals filed for this visit. There is no height or weight on file to calculate BMI. Constitutional Well developed. Well nourished.  Vascular Dorsalis pedis pulses palpable bilaterally. Posterior tibial pulses palpable bilaterally. Capillary refill normal to all digits.  No cyanosis or clubbing noted. Pedal hair growth normal.  Neurologic Normal speech. Oriented to person, place, and time. Epicritic sensation to light touch grossly present bilaterally.  Dermatologic Nails well groomed and normal in appearance. No open wounds. No skin lesions.  Orthopedic: Pain on palpation to the left plantar midfoot at the site of plantar fibroma.  Single lobulated indurated soft tissue mass noted.  No signs of malignancy noted.  Appears to be within the band of the plantar fascia.  Gait examination shows bilateral pes planovalgus with calcaneovalgus to many toe signs unable to recreate the arch with dorsiflexion of the hallux   Radiographs: 3 views of skeletally mature adult left foot: There is decreasing calcaneal inclination angle increasing talar declination angle anterior break in the cyma line findings consistent with flatfoot deformity.  Mild midfoot arthritis noted. Assessment:   1. Pes planovalgus   2. Plantar fibromatosis    Plan:  Patient was evaluated and treated and all questions answered.  Left plantar fibroma -All questions and concerns were discussed with the patient in extensive  detail given that she has failed all conservative care she will benefit from surgical excision of the fibroma.  I discussed the risk and benefits associate with this she states that she would like to proceed with excision of plantar fibroma. -Informed surgical risk consent was reviewed and read aloud to the patient.  I reviewed the films.  I have discussed my findings with the patient in great detail.  I have discussed all risks including but not limited to  infection, stiffness, scarring, limp, disability, deformity, damage to blood vessels and nerves, numbness, poor healing, need for braces, arthritis, chronic pain, amputation, death.  All benefits and realistic expectations discussed in great detail.  I have made no promises as to the outcome.  I have provided realistic expectations.  I have offered the patient a 2nd opinion, which they have declined and assured me they preferred to proceed despite the risks   Pes planovalgus -I explained to patient the etiology of pes planovalgus and relationship with Planter fasciitis and various treatment options were discussed.  Given patient foot structure in the setting of Planter fasciitis I believe patient will benefit from custom-made orthotics to help control the hindfoot motion support the arch of the foot and take the stress away from plantar fascial.  Patient agrees with the plan like to proceed with orthotics -Patient was casted for orthotics

## 2022-09-28 NOTE — Addendum Note (Signed)
Addended by: Nicholes Rough on: 09/28/2022 07:58 AM   Modules accepted: Orders

## 2022-10-01 ENCOUNTER — Other Ambulatory Visit (HOSPITAL_COMMUNITY)
Admission: RE | Admit: 2022-10-01 | Discharge: 2022-10-01 | Disposition: A | Discharge status: Home / Self Care | Payer: BC Managed Care – PPO | Source: Ambulatory Visit | Attending: Family Medicine | Admitting: Family Medicine

## 2022-10-01 ENCOUNTER — Ambulatory Visit (INDEPENDENT_AMBULATORY_CARE_PROVIDER_SITE_OTHER): Payer: 59 | Admitting: Family Medicine

## 2022-10-01 ENCOUNTER — Encounter: Payer: Self-pay | Admitting: Family Medicine

## 2022-10-01 VITALS — BP 116/64 | HR 57 | Temp 98.2°F | Ht 65.0 in | Wt 171.0 lb

## 2022-10-01 DIAGNOSIS — Z124 Encounter for screening for malignant neoplasm of cervix: Secondary | ICD-10-CM | POA: Insufficient documentation

## 2022-10-01 NOTE — Patient Instructions (Addendum)
We will let you know the results of the lab test when we get them.  I do not see in your chart who placed your IUD.  If you want to go to the same person please see if you can find record of who it is and call their office.  If you would like a referral to a new gynecologist let us know and we can place that referral.  Have a great day,  Dr. Constance Goltz

## 2022-10-01 NOTE — Progress Notes (Signed)
   Established Patient Office Visit  Subjective   Patient ID: Kimberly Snyder, female    DOB: [birth year]  Age: 42 y.o. MRN: [id number]  Chief Complaint  Patient presents with   Gynecologic Exam    Patient here for Pap smear did not have any other complaints.  Did not have any other issues to discuss.  Is planning to getting fibroma surgery on the bottom of her foot in the next month.  Patient okay with getting STI testing for gonorrhea chlamydia trichomonas.  Does not have any complaints of discharge or vaginal odor.  Patient states it is almost time for her IUD to be replaced.  She does not remember the exact name of her gynecologist when they placed it approximately 6 years ago.  Advised her that we can send in referral if she does not remember the name of her previous gynecologist or if she would like to see somebody new.  Gynecologic Exam The patient's pertinent negatives include no genital itching, genital odor, genital rash or vaginal discharge. She uses an IUD for contraception.      Review of Systems  Genitourinary:  Negative for vaginal discharge.      Objective:     BP 116/64   Pulse (!) 57   Temp 98.2 F (36.8 C) (Oral)   Ht  (1.651 m)   Wt 171 lb (77.6 kg)   SpO2 98%   BMI 28.46 kg/m    Physical Exam General: Alert and oriented GU: Normal appearing cervix.  Some scant discharge surrounding the cervical os.  IUD strings visible.   No results found for any visits on 10/01/22.    The 10-year ASCVD risk score (Arnett DK, et al., 2019) is: 0.3%    Assessment & Plan:   Problem List Items Addressed This Visit       Other   Screening for malignant neoplasm of cervix - Primary    Normal-appearing cervix.  IUD strings visible. - Follow-up results.  Also tested for gonorrhea chlamydia and trichomonas.       Relevant Orders   Cytology - PAP( [facility])    No follow-ups on file.    [name], MD

## 2022-10-01 NOTE — Assessment & Plan Note (Signed)
Normal-appearing cervix.  IUD strings visible. - Follow-up results.  Also tested for gonorrhea chlamydia and trichomonas.

## 2022-10-04 ENCOUNTER — Telehealth: Payer: Self-pay | Admitting: Urology

## 2022-10-04 NOTE — Telephone Encounter (Signed)
DOS - 10/29/22  PLANTAR FIBROMA LEFT --- 78295  BCBS EFFECTIVE DATE - 06/11/22  DEDUCTIBLE - $1,600.00 W/ $535.05 REMAINING OOP - $4,000.00 W/ $2,935.05 REMAINING COINSURANCE - 0%  SPOKE WITH [name]. WITH BCBS AND SHE STATED THAT FOR CPT CODE 62130 NO PRIOR AUTH IS REQUIRED.  CALL REF # [id number]   [employer] EFFECTIVE DATE - 05/11/22  DEDUCTIBLE - $3,000.00 W / Remaining: $2,726.46 OOP - $6,000.00 W / Remaining: $5,726.46  COINSURANCE - 0%  PER UHC WEBSITE FOR CPT CODE 62952 Notification or Prior Authorization is not required for the requested services This [employer] Commercial member's plan does not currently require a prior authorization for these services. If you have general questions about the prior authorization requirements, please call us at [phone] or visit [email or web address] > Clinician Resources > Advance and Admission Notification Requirements. The number above acknowledges your notification. Please write this number down for future reference. Notification is not a guarantee of coverage or payment.   Decision ID #: [id number]

## 2022-10-05 LAB — CYTOLOGY - PAP
Chlamydia: NEGATIVE
Comment: NEGATIVE
Comment: NEGATIVE
Comment: NEGATIVE
Comment: NORMAL
High risk HPV: NEGATIVE
Neisseria Gonorrhea: NEGATIVE
Trichomonas: NEGATIVE

## 2022-10-24 ENCOUNTER — Ambulatory Visit
Admission: RE | Admit: 2022-10-24 | Discharge: 2022-10-24 | Disposition: A | Discharge status: Home / Self Care | Payer: BC Managed Care – PPO | Source: Ambulatory Visit | Attending: Family Medicine | Admitting: Family Medicine

## 2022-10-24 DIAGNOSIS — Z1231 Encounter for screening mammogram for malignant neoplasm of breast: Secondary | ICD-10-CM | POA: Diagnosis not present

## 2022-10-26 ENCOUNTER — Other Ambulatory Visit: Payer: Self-pay | Admitting: Family Medicine

## 2022-10-26 DIAGNOSIS — R928 Other abnormal and inconclusive findings on diagnostic imaging of breast: Secondary | ICD-10-CM

## 2022-10-29 ENCOUNTER — Other Ambulatory Visit: Payer: Self-pay | Admitting: Podiatry

## 2022-10-29 ENCOUNTER — Encounter: Payer: Self-pay | Admitting: Podiatry

## 2022-10-29 DIAGNOSIS — D429 Neoplasm of uncertain behavior of meninges, unspecified: Secondary | ICD-10-CM | POA: Diagnosis not present

## 2022-10-29 DIAGNOSIS — D492 Neoplasm of unspecified behavior of bone, soft tissue, and skin: Secondary | ICD-10-CM

## 2022-10-29 DIAGNOSIS — D2122 Benign neoplasm of connective and other soft tissue of left lower limb, including hip: Secondary | ICD-10-CM | POA: Diagnosis not present

## 2022-10-29 MED ORDER — IBUPROFEN 800 MG PO TABS: 800.0000 mg | ORAL_TABLET | Freq: Four times a day (QID) | ORAL | 1 refills | Status: AC | PRN

## 2022-10-29 MED ORDER — OXYCODONE-ACETAMINOPHEN 5-325 MG PO TABS: 1.0000 | ORAL_TABLET | ORAL | 0 refills | Status: DC | PRN

## 2022-11-07 ENCOUNTER — Ambulatory Visit (INDEPENDENT_AMBULATORY_CARE_PROVIDER_SITE_OTHER): Payer: BC Managed Care – PPO | Admitting: Podiatry

## 2022-11-07 ENCOUNTER — Ambulatory Visit: Payer: BC Managed Care – PPO

## 2022-11-07 DIAGNOSIS — Z9889 Other specified postprocedural states: Secondary | ICD-10-CM

## 2022-11-07 NOTE — Progress Notes (Signed)
  Subjective:  Patient ID: Kimberly Snyder, female    DOB: 20-Jul-1980,  MRN: [id number]  Chief Complaint  Patient presents with   Routine Post Op    POV # 1 DOS 10/29/22 --- LEFT EXCISION OF PLANTAR FIBROMA    DOS: 10/29/22 Procedure: Left excision plantar fibroma  42 y.o. female returns for post-op check.  Doing well.  No acute complaints bandages clean dry and intact  Review of Systems: Negative except as noted in the HPI. Denies N/V/F/Ch.  Past Medical History:  Diagnosis Date   Anxiety    Chiari malformation    Depression    Migraine     Current Outpatient Medications:    ALPRAZolam (XANAX) 1 MG tablet, Take 1 mg by mouth 3 (three) times daily., Disp: , Rfl:    amphetamine-dextroamphetamine (ADDERALL) 20 MG tablet, Take 20 mg by mouth 4 (four) times daily. , Disp: , Rfl:    HYDROcodone-acetaminophen (NORCO/VICODIN) 5-325 MG tablet, Take 1-2 tablets by mouth every 4 (four) hours as needed. (Patient not taking: Reported on 09/03/2022), Disp: 12 tablet, Rfl: 0   ibuprofen (ADVIL) 800 MG tablet, Take 1 tablet (800 mg total) by mouth every 6 (six) hours as needed., Disp: 60 tablet, Rfl: 1   naproxen (NAPROSYN) 500 MG tablet, Take 1 tablet (500 mg total) by mouth 2 (two) times daily. (Patient not taking: Reported on 09/03/2022), Disp: 30 tablet, Rfl: 0   oxyCODONE-acetaminophen (PERCOCET) 5-325 MG tablet, Take 1 tablet by mouth every 4 (four) hours as needed for severe pain., Disp: 30 tablet, Rfl: 0   promethazine (PHENERGAN) 25 MG tablet, Take 1 tablet (25 mg total) by mouth every 6 (six) hours as needed for nausea., Disp: 12 tablet, Rfl: 0   vortioxetine HBr (TRINTELLIX) 10 MG TABS tablet, Take 10 mg by mouth daily., Disp: , Rfl:   Social History   Tobacco Use  Smoking Status Never  Smokeless Tobacco Not on file    Allergies  Allergen Reactions   Wasp Venom Itching   Objective:  There were no vitals filed for this visit. There is no height or weight on file to calculate  BMI. Constitutional Well developed. Well nourished.  Vascular Foot warm and well perfused. Capillary refill normal to all digits.   Neurologic Normal speech. Oriented to person, place, and time. Epicritic sensation to light touch grossly present bilaterally.  Dermatologic Skin healing well without signs of infection. Skin edges well coapted without signs of infection.  Orthopedic: Tenderness to palpation noted about the surgical site.   Radiographs: None Assessment:   1. Post-operative state    Plan:  Patient was evaluated and treated and all questions answered.  S/p foot surgery left -Progressing as expected post-operatively. -XR: See above -WB Status: Nonweightbearing in left lower extremity -Sutures: Intact.  No clinical signs of Deis is noted no complication noted. -Medications: None -Foot redressed.  No follow-ups on file.

## 2022-11-15 ENCOUNTER — Ambulatory Visit
Admission: RE | Admit: 2022-11-15 | Discharge: 2022-11-15 | Disposition: A | Discharge status: Home / Self Care | Payer: BC Managed Care – PPO | Source: Ambulatory Visit | Attending: Family Medicine | Admitting: Family Medicine

## 2022-11-15 DIAGNOSIS — N6021 Fibroadenosis of right breast: Secondary | ICD-10-CM | POA: Diagnosis not present

## 2022-11-15 DIAGNOSIS — R928 Other abnormal and inconclusive findings on diagnostic imaging of breast: Secondary | ICD-10-CM

## 2022-11-15 DIAGNOSIS — R922 Inconclusive mammogram: Secondary | ICD-10-CM | POA: Diagnosis not present

## 2022-11-15 DIAGNOSIS — N6489 Other specified disorders of breast: Secondary | ICD-10-CM | POA: Diagnosis not present

## 2022-11-21 ENCOUNTER — Encounter: Payer: Self-pay | Admitting: Podiatry

## 2022-11-21 ENCOUNTER — Ambulatory Visit (INDEPENDENT_AMBULATORY_CARE_PROVIDER_SITE_OTHER): Payer: BC Managed Care – PPO | Admitting: Podiatry

## 2022-11-21 DIAGNOSIS — Z9889 Other specified postprocedural states: Secondary | ICD-10-CM

## 2022-11-21 NOTE — Progress Notes (Signed)
  Subjective:  Patient ID: Kimberly Snyder, female    DOB: Jun 16, 1980,  MRN: [id number]  Chief Complaint  Patient presents with   Routine Post Op    POV # 2 DOS 10/29/22 --- LEFT EXCISION OF PLANTAR FIBROMA. Patient states the incision site has been super tender. She is on take over counter meds. Patient is ready to get the sutures removed. Patient thinks she may be allergic to the advil . Patient has been icing and keeping her foot elevated.     DOS: 10/29/22 Procedure: Left excision plantar fibroma  42 y.o. female returns for post-op check.  Doing well.  No acute complaints bandages clean dry and intact  Review of Systems: Negative except as noted in the HPI. Denies N/V/F/Ch.  Past Medical History:  Diagnosis Date   Anxiety    Chiari malformation    Depression    Migraine     Current Outpatient Medications:    ALPRAZolam (XANAX) 1 MG tablet, Take 1 mg by mouth 3 (three) times daily., Disp: , Rfl:    amphetamine-dextroamphetamine (ADDERALL) 20 MG tablet, Take 20 mg by mouth 4 (four) times daily. , Disp: , Rfl:    HYDROcodone-acetaminophen (NORCO/VICODIN) 5-325 MG tablet, Take 1-2 tablets by mouth every 4 (four) hours as needed. (Patient not taking: Reported on 09/03/2022), Disp: 12 tablet, Rfl: 0   ibuprofen (ADVIL) 800 MG tablet, Take 1 tablet (800 mg total) by mouth every 6 (six) hours as needed., Disp: 60 tablet, Rfl: 1   naproxen (NAPROSYN) 500 MG tablet, Take 1 tablet (500 mg total) by mouth 2 (two) times daily. (Patient not taking: Reported on 09/03/2022), Disp: 30 tablet, Rfl: 0   oxyCODONE-acetaminophen (PERCOCET) 5-325 MG tablet, Take 1 tablet by mouth every 4 (four) hours as needed for severe pain., Disp: 30 tablet, Rfl: 0   promethazine (PHENERGAN) 25 MG tablet, Take 1 tablet (25 mg total) by mouth every 6 (six) hours as needed for nausea., Disp: 12 tablet, Rfl: 0   vortioxetine HBr (TRINTELLIX) 10 MG TABS tablet, Take 10 mg by mouth daily., Disp: , Rfl:   Social History    Tobacco Use  Smoking Status Never  Smokeless Tobacco Not on file    Allergies  Allergen Reactions   Wasp Venom Itching   Objective:  There were no vitals filed for this visit. There is no height or weight on file to calculate BMI. Constitutional Well developed. Well nourished.  Vascular Foot warm and well perfused. Capillary refill normal to all digits.   Neurologic Normal speech. Oriented to person, place, and time. Epicritic sensation to light touch grossly present bilaterally.  Dermatologic Skin healing well without signs of infection. Skin edges well coapted without signs of infection.  Orthopedic: Tenderness to palpation noted about the surgical site.   Radiographs: None Assessment:   No diagnosis found.  Plan:  Patient was evaluated and treated and all questions answered.  S/p foot surgery left -Progressing as expected post-operatively. -XR: See above -WB Status: Weightbearing as tolerated left lower extremity -Sutures: None no clinical signs of Deis is noted no complication noted. -Medications: None -Foot redressed. -Orthotics were dispensed and they are functioning well  No follow-ups on file.

## 2023-02-01 DIAGNOSIS — F410 Panic disorder [episodic paroxysmal anxiety] without agoraphobia: Secondary | ICD-10-CM | POA: Diagnosis not present

## 2023-02-01 DIAGNOSIS — F3342 Major depressive disorder, recurrent, in full remission: Secondary | ICD-10-CM | POA: Diagnosis not present

## 2023-02-01 DIAGNOSIS — F900 Attention-deficit hyperactivity disorder, predominantly inattentive type: Secondary | ICD-10-CM | POA: Diagnosis not present

## 2023-04-02 ENCOUNTER — Encounter: Payer: Self-pay | Admitting: Family Medicine

## 2023-04-02 ENCOUNTER — Ambulatory Visit (INDEPENDENT_AMBULATORY_CARE_PROVIDER_SITE_OTHER): Payer: BC Managed Care – PPO | Admitting: Family Medicine

## 2023-04-02 VITALS — BP 104/68 | HR 56 | Ht 65.0 in | Wt 173.8 lb

## 2023-04-02 DIAGNOSIS — Z124 Encounter for screening for malignant neoplasm of cervix: Secondary | ICD-10-CM

## 2023-04-02 DIAGNOSIS — Z23 Encounter for immunization: Secondary | ICD-10-CM

## 2023-04-02 DIAGNOSIS — T148XXD Other injury of unspecified body region, subsequent encounter: Secondary | ICD-10-CM

## 2023-04-02 DIAGNOSIS — L97529 Non-pressure chronic ulcer of other part of left foot with unspecified severity: Secondary | ICD-10-CM

## 2023-04-02 DIAGNOSIS — L84 Corns and callosities: Secondary | ICD-10-CM | POA: Diagnosis not present

## 2023-04-02 DIAGNOSIS — C539 Malignant neoplasm of cervix uteri, unspecified: Secondary | ICD-10-CM | POA: Diagnosis not present

## 2023-04-02 LAB — POCT GLYCOSYLATED HEMOGLOBIN (HGB A1C): HbA1c POC (<> result, manual entry): 5 % (ref 4.0–5.6)

## 2023-04-02 MED ORDER — TETANUS-DIPHTH-ACELL PERTUSSIS 5-2.5-18.5 LF-MCG/0.5 IM SUSY: 0.5000 mL | PREFILLED_SYRINGE | Freq: Once | INTRAMUSCULAR | 0 refills | Status: DC

## 2023-04-02 NOTE — Assessment & Plan Note (Signed)
Patient has not followed up with her gynecologist regarding the abnormal Pap smear result.  Will send message to patient's current gynecologist to call patient to have her schedule an appointment.

## 2023-04-02 NOTE — Patient Instructions (Addendum)
It was nice to see you today,  We addressed the following topics today: -I will send in a referral to the gynecologist - We will get the flu and tetanus shot today. The I will order an A1c to check your blood sugar. - In order to help with wound healing you should continue to use over-the-counter pressure moisturizers.  Have a great day,  Frederic Jericho, MD

## 2023-04-02 NOTE — Assessment & Plan Note (Signed)
Patient has poorly healing callus around the plantar midfoot area on the left side.  Patient is using lotion and is wearing comfortable shoes.  Patient was concerned about hyperglycemia causing poor healing.  Her A1c was improved from previous, it went from 5.6-5.0.  Patient has a podiatrist and she is going to schedule an appointment with them to discuss further treatment.
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
## 2023-07-29 DIAGNOSIS — F900 Attention-deficit hyperactivity disorder, predominantly inattentive type: Secondary | ICD-10-CM | POA: Diagnosis not present

## 2023-07-29 DIAGNOSIS — F410 Panic disorder [episodic paroxysmal anxiety] without agoraphobia: Secondary | ICD-10-CM | POA: Diagnosis not present

## 2023-07-29 DIAGNOSIS — F331 Major depressive disorder, recurrent, moderate: Secondary | ICD-10-CM | POA: Diagnosis not present

## 2023-08-05 DIAGNOSIS — F332 Major depressive disorder, recurrent severe without psychotic features: Secondary | ICD-10-CM | POA: Diagnosis not present

## 2023-08-09 DIAGNOSIS — F332 Major depressive disorder, recurrent severe without psychotic features: Secondary | ICD-10-CM | POA: Diagnosis not present

## 2023-08-15 DIAGNOSIS — F332 Major depressive disorder, recurrent severe without psychotic features: Secondary | ICD-10-CM | POA: Diagnosis not present

## 2023-08-16 DIAGNOSIS — F332 Major depressive disorder, recurrent severe without psychotic features: Secondary | ICD-10-CM | POA: Diagnosis not present

## 2023-08-21 DIAGNOSIS — F332 Major depressive disorder, recurrent severe without psychotic features: Secondary | ICD-10-CM | POA: Diagnosis not present

## 2023-08-29 DIAGNOSIS — F332 Major depressive disorder, recurrent severe without psychotic features: Secondary | ICD-10-CM | POA: Diagnosis not present

## 2023-09-05 DIAGNOSIS — F332 Major depressive disorder, recurrent severe without psychotic features: Secondary | ICD-10-CM | POA: Diagnosis not present

## 2023-09-09 DIAGNOSIS — F3341 Major depressive disorder, recurrent, in partial remission: Secondary | ICD-10-CM | POA: Diagnosis not present

## 2023-09-12 DIAGNOSIS — F332 Major depressive disorder, recurrent severe without psychotic features: Secondary | ICD-10-CM | POA: Diagnosis not present

## 2023-09-13 DIAGNOSIS — F332 Major depressive disorder, recurrent severe without psychotic features: Secondary | ICD-10-CM | POA: Diagnosis not present

## 2023-09-18 ENCOUNTER — Other Ambulatory Visit: Payer: Self-pay | Admitting: *Deleted

## 2023-09-18 DIAGNOSIS — E663 Overweight: Secondary | ICD-10-CM

## 2023-09-18 DIAGNOSIS — I10 Essential (primary) hypertension: Secondary | ICD-10-CM

## 2023-09-18 DIAGNOSIS — Z119 Encounter for screening for infectious and parasitic diseases, unspecified: Secondary | ICD-10-CM

## 2023-09-18 DIAGNOSIS — E7800 Pure hypercholesterolemia, unspecified: Secondary | ICD-10-CM

## 2023-09-18 DIAGNOSIS — F332 Major depressive disorder, recurrent severe without psychotic features: Secondary | ICD-10-CM | POA: Diagnosis not present

## 2023-09-18 DIAGNOSIS — E039 Hypothyroidism, unspecified: Secondary | ICD-10-CM

## 2023-09-19 DIAGNOSIS — F332 Major depressive disorder, recurrent severe without psychotic features: Secondary | ICD-10-CM | POA: Diagnosis not present

## 2023-09-23 DIAGNOSIS — F332 Major depressive disorder, recurrent severe without psychotic features: Secondary | ICD-10-CM | POA: Diagnosis not present

## 2023-09-24 ENCOUNTER — Other Ambulatory Visit: Payer: BC Managed Care – PPO

## 2023-10-01 ENCOUNTER — Encounter: Payer: BC Managed Care – PPO | Admitting: Family Medicine

## 2023-10-01 NOTE — Progress Notes (Deleted)
   Annual physical  Subjective    Patient ID: Kimberly Snyder, female    DOB: 1981/04/01  Age: 43 y.o. MRN: [id number]  No chief complaint on file.  HPI Kimberly Snyder is a 43 y.o. old female here  for annual exam.   Work:*** Relationship:*** Children:*** Tobacco:*** Alcohol:*** Recreational drugs:*** Menstruation:*** Diet:*** Exercise:***  Family history of breast, ovarian, endometrial, colorectal cancer:***  Advance directive:***  Other providers:***   The patient has the following chronic health issues that are monitored on a routine basis: Did not get any of the labs on 4/9.  Get them today.  Xanax 1 mg 3 times daily prescribed by Reginald Capri Adderall 20 mg 3 times daily prescribed by Rupinder Carr Esketamine prescribed by Lavaun Porto 40 Loxitane prescribed by Rupinder Carter-20 mg  Replaced IUD?  Pap smear repeat?  HPI  Separate, acute concerns today: ***  The 10-year ASCVD risk score (Arnett DK, et al., 2019) is: 0.2%  Health Maintenance Due  Topic Date Due   COVID-19 Vaccine (1) Never done   HIV Screening  Never done   Hepatitis C Screening  Never done      Objective:     There were no vitals taken for this visit. {Vitals History (Optional):23777}  Physical Exam   No results found for any visits on 10/01/23.      Assessment & Plan:   There are no diagnoses linked to this encounter.   No follow-ups on file.    Laneta Pintos, MD

## 2023-10-07 DIAGNOSIS — F332 Major depressive disorder, recurrent severe without psychotic features: Secondary | ICD-10-CM | POA: Diagnosis not present

## 2023-10-11 DIAGNOSIS — F332 Major depressive disorder, recurrent severe without psychotic features: Secondary | ICD-10-CM | POA: Diagnosis not present

## 2023-10-17 DIAGNOSIS — F332 Major depressive disorder, recurrent severe without psychotic features: Secondary | ICD-10-CM | POA: Diagnosis not present

## 2023-10-31 DIAGNOSIS — F332 Major depressive disorder, recurrent severe without psychotic features: Secondary | ICD-10-CM | POA: Diagnosis not present

## 2023-11-08 DIAGNOSIS — F332 Major depressive disorder, recurrent severe without psychotic features: Secondary | ICD-10-CM | POA: Diagnosis not present

## 2023-11-29 DIAGNOSIS — F332 Major depressive disorder, recurrent severe without psychotic features: Secondary | ICD-10-CM | POA: Diagnosis not present

## 2023-12-20 DIAGNOSIS — F332 Major depressive disorder, recurrent severe without psychotic features: Secondary | ICD-10-CM | POA: Diagnosis not present

## 2023-12-31 ENCOUNTER — Ambulatory Visit (INDEPENDENT_AMBULATORY_CARE_PROVIDER_SITE_OTHER)

## 2023-12-31 VITALS — BP 115/79 | HR 55 | Temp 97.5°F | Ht 65.0 in | Wt 177.1 lb

## 2023-12-31 DIAGNOSIS — M940 Chondrocostal junction syndrome [Tietze]: Secondary | ICD-10-CM | POA: Insufficient documentation

## 2023-12-31 DIAGNOSIS — R0789 Other chest pain: Secondary | ICD-10-CM

## 2023-12-31 MED ORDER — CELECOXIB 100 MG PO CAPS: 100.0000 mg | ORAL_CAPSULE | Freq: Two times a day (BID) | ORAL | 0 refills | Status: AC

## 2023-12-31 MED ORDER — CYCLOBENZAPRINE HCL 10 MG PO TABS: 10.0000 mg | ORAL_TABLET | Freq: Three times a day (TID) | ORAL | 0 refills | Status: AC | PRN

## 2023-12-31 NOTE — Assessment & Plan Note (Signed)
 Given sudden onset of her pain and the location localized to the center sternum and bilateral surrounding ribs, suspect costochondritis versus acute chest wall/muscle strain.  EKG performed in clinic today which showed no concerning findings aside from mild sinus bradycardia. Start Celebrex  100 mg twice daily as needed for the next 4 weeks.  Have also provided a prescription for  cyclobenzaprine  10 mg as needed for associated muscle spasm.  Advised the patient that this medication can cause sedation.  Also provided the patient with a work note excusing her from work until Monday, 01/06/2024.  Advised the patient if she should begin to experience crushing chest pain, left arm pain, shortness of breath, other alarming, that she should go straight to the ER for further evaluation.

## 2023-12-31 NOTE — Patient Instructions (Addendum)
 It was nice to see you today!  As we discussed in clinic:  -I have sent in Celebrex  100 mg that can be taken twice daily for pain. It is an anti-inflammatory so I recommend taking it with food to avoid upset stomach.  -I have also sent in cyclobenzaprine , which is a muscle relaxer that can be taken up to three times a day for muscle spasms. This medication can cause sedation so start by taking it only at bedtime and then you can increase usage as needed from there.  -I have provided you with a work note that says you may return to work on 01/06/24.  -If the pain is not improving or is getting worse, please let me know so that we can bring you back in for further evaluation.   -We did an EKG on you which showed no abnormalities related to your heart. If you should begin to experience crushing chest pain, pain down the left arm, or shortness of breath, please go to the ER for further evaluation.   I will plan to see you back in a few months for your follow up! It was nice to meet you!  If you have any problems before your next visit feel free to message me via MyChart (minor issues or questions) or call the office, otherwise you may reach out to schedule an office visit.  Thank you! Saddie Sacks, PA-C

## 2023-12-31 NOTE — Progress Notes (Signed)
 Acute Office Visit  Subjective:     Patient ID: Kimberly Snyder, female    DOB: [birth year], 43 y.o.   MRN: [id number]  Chief Complaint  Patient presents with   Chest Pain    Onset:Saturday night; Ibuprofen  and Dual combo pill Motrin /Tylenol     HPI  Kimberly Snyder is a 43 y.o. y/o female who presents to the clinic today c/o chest pain.  Patient reports that on Saturday (12/28/2023) she woke up that morning and noticed that her chest wall was very sore.  Denies any known injury.  Does report that the day before, on Friday, she worked a shift at [employer].  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  Patient reports that the pain is aching/stabbing in nature and describes associated muscle spasms that initiate in the sternum and radiate up toward both shoulders.  The pain is worsened by movement such as lifting, twisting, laughing, coughing, taking a deep breath then.  The patient has been taking Tylenol  and Motrin , which she reports does help with the pain, however when the medication wears off the pain comes back.  Denies crushing chest pain, radiation to the back, left arm pain, numbness or tingling, recent upper respiratory symptoms, shortness of breath, reflux/burning sensations.   ROS Per HPI     Objective:    BP 115/79   Pulse (!) 55   Temp (!) 97.5 F (36.4 C) (Oral)   Ht 5' 5 (1.651 m)   Wt 177 lb 1.9 oz (80.3 kg)   SpO2 100%   BMI 29.47 kg/m    Physical Exam Constitutional:      General: She is not in acute distress.    Appearance: Normal appearance.  Cardiovascular:     Rate and Rhythm: Normal rate and regular rhythm.     Heart sounds: Normal heart sounds. No murmur heard.    No friction rub. No gallop.  Pulmonary:     Effort: Pulmonary effort is normal. No respiratory distress.     Breath sounds: Normal breath sounds.  Chest:     Chest wall: Tenderness present. No deformity or swelling.   Musculoskeletal:        General: No swelling.  Skin:    General: Skin is warm and dry.  Neurological:     General: No focal deficit present.     Mental Status: She is alert.  Psychiatric:        Mood and Affect: Mood normal.        Behavior: Behavior normal.        Thought Content: Thought content normal.    No results found for any visits on 12/31/23.      Assessment & Plan:   Chest wall pain -     EKG 12-Lead  Costochondritis, acute Assessment & Plan: Given sudden onset of her pain and the location localized to the center sternum and bilateral surrounding ribs, suspect costochondritis versus acute chest wall/muscle strain.  EKG performed in clinic today which showed no concerning findings aside from mild sinus bradycardia. Start Celebrex  100 mg twice daily as needed for the next 4 weeks.  Have also provided a prescription for  cyclobenzaprine  10 mg as needed for associated muscle spasm.  Advised the patient that this medication can cause sedation.  Also provided the patient with a work note excusing her from work until Monday, 01/06/2024.  Advised the patient if she should begin to experience crushing  chest pain, left arm pain, shortness of breath, other alarming, that she should go straight to the ER for further evaluation.   Other orders -     Celecoxib ; Take 1 capsule (100 mg total) by mouth 2 (two) times daily.  Dispense: 60 capsule; Refill: 0 -     Cyclobenzaprine  HCl; Take 1 tablet (10 mg total) by mouth 3 (three) times daily as needed for muscle spasms.  Dispense: 30 tablet; Refill: 0    Return in about 3 months (around 04/01/2024) for Physical.  Kimberly JULIANNA Sacks, PA-C

## 2024-01-16 DIAGNOSIS — F332 Major depressive disorder, recurrent severe without psychotic features: Secondary | ICD-10-CM | POA: Diagnosis not present

## 2024-01-29 DIAGNOSIS — F332 Major depressive disorder, recurrent severe without psychotic features: Secondary | ICD-10-CM | POA: Diagnosis not present

## 2024-01-31 DIAGNOSIS — F332 Major depressive disorder, recurrent severe without psychotic features: Secondary | ICD-10-CM | POA: Diagnosis not present

## 2024-02-06 ENCOUNTER — Other Ambulatory Visit: Payer: Self-pay | Admitting: Medical Genetics

## 2024-02-11 DIAGNOSIS — F332 Major depressive disorder, recurrent severe without psychotic features: Secondary | ICD-10-CM | POA: Diagnosis not present

## 2024-02-12 DIAGNOSIS — F332 Major depressive disorder, recurrent severe without psychotic features: Secondary | ICD-10-CM | POA: Diagnosis not present

## 2024-02-14 DIAGNOSIS — F332 Major depressive disorder, recurrent severe without psychotic features: Secondary | ICD-10-CM | POA: Diagnosis not present

## 2024-03-03 DIAGNOSIS — F410 Panic disorder [episodic paroxysmal anxiety] without agoraphobia: Secondary | ICD-10-CM | POA: Diagnosis not present

## 2024-03-03 DIAGNOSIS — F3342 Major depressive disorder, recurrent, in full remission: Secondary | ICD-10-CM | POA: Diagnosis not present

## 2024-03-03 DIAGNOSIS — F900 Attention-deficit hyperactivity disorder, predominantly inattentive type: Secondary | ICD-10-CM | POA: Diagnosis not present

## 2024-03-05 DIAGNOSIS — F332 Major depressive disorder, recurrent severe without psychotic features: Secondary | ICD-10-CM | POA: Diagnosis not present

## 2024-03-06 DIAGNOSIS — F332 Major depressive disorder, recurrent severe without psychotic features: Secondary | ICD-10-CM | POA: Diagnosis not present

## 2024-03-16 DIAGNOSIS — F332 Major depressive disorder, recurrent severe without psychotic features: Secondary | ICD-10-CM | POA: Diagnosis not present

## 2024-03-17 DIAGNOSIS — F332 Major depressive disorder, recurrent severe without psychotic features: Secondary | ICD-10-CM | POA: Diagnosis not present

## 2024-03-25 ENCOUNTER — Other Ambulatory Visit

## 2024-03-25 DIAGNOSIS — E039 Hypothyroidism, unspecified: Secondary | ICD-10-CM

## 2024-03-25 DIAGNOSIS — E7800 Pure hypercholesterolemia, unspecified: Secondary | ICD-10-CM

## 2024-03-25 DIAGNOSIS — E663 Overweight: Secondary | ICD-10-CM

## 2024-03-25 DIAGNOSIS — I10 Essential (primary) hypertension: Secondary | ICD-10-CM

## 2024-03-25 DIAGNOSIS — Z119 Encounter for screening for infectious and parasitic diseases, unspecified: Secondary | ICD-10-CM

## 2024-03-26 ENCOUNTER — Ambulatory Visit: Payer: Self-pay | Admitting: Family Medicine

## 2024-03-26 LAB — CBC WITH DIFFERENTIAL/PLATELET
Basophils Absolute: 0 x10E3/uL (ref 0.0–0.2)
Basos: 1 %
EOS (ABSOLUTE): 0.1 x10E3/uL (ref 0.0–0.4)
Eos: 4 %
Hematocrit: 39.4 % (ref 34.0–46.6)
Hemoglobin: 12.5 g/dL (ref 11.1–15.9)
Immature Grans (Abs): 0 x10E3/uL (ref 0.0–0.1)
Immature Granulocytes: 0 %
Lymphocytes Absolute: 1.6 x10E3/uL (ref 0.7–3.1)
Lymphs: 53 %
MCH: 30.8 pg (ref 26.6–33.0)
MCHC: 31.7 g/dL (ref 31.5–35.7)
MCV: 97 fL (ref 79–97)
Monocytes Absolute: 0.3 x10E3/uL (ref 0.1–0.9)
Monocytes: 8 %
Neutrophils Absolute: 1 x10E3/uL — ABNORMAL LOW (ref 1.4–7.0)
Neutrophils: 34 %
Platelets: 240 x10E3/uL (ref 150–450)
RBC: 4.06 x10E6/uL (ref 3.77–5.28)
RDW: 13 % (ref 11.7–15.4)
WBC: 3.1 x10E3/uL — ABNORMAL LOW (ref 3.4–10.8)

## 2024-03-26 LAB — COMPREHENSIVE METABOLIC PANEL WITH GFR
ALT: 13 IU/L (ref 0–32)
AST: 21 IU/L (ref 0–40)
Albumin: 4.8 g/dL (ref 3.9–4.9)
Alkaline Phosphatase: 56 IU/L (ref 41–116)
BUN/Creatinine Ratio: 10 (ref 9–23)
BUN: 9 mg/dL (ref 6–24)
Bilirubin Total: 0.5 mg/dL (ref 0.0–1.2)
CO2: 23 mmol/L (ref 20–29)
Calcium: 9.6 mg/dL (ref 8.7–10.2)
Chloride: 104 mmol/L (ref 96–106)
Creatinine, Ser: 0.89 mg/dL (ref 0.57–1.00)
Globulin, Total: 2.1 g/dL (ref 1.5–4.5)
Glucose: 80 mg/dL (ref 70–99)
Potassium: 4.6 mmol/L (ref 3.5–5.2)
Sodium: 141 mmol/L (ref 134–144)
Total Protein: 6.9 g/dL (ref 6.0–8.5)
eGFR: 81 mL/min/1.73 (ref >59)

## 2024-03-26 LAB — HEMOGLOBIN A1C
Est. average glucose Bld gHb Est-mCnc: 103 mg/dL
Hgb A1c MFr Bld: 5.2 % (ref 4.8–5.6)

## 2024-03-26 LAB — LIPID PANEL
Chol/HDL Ratio: 2.6 ratio (ref 0.0–4.4)
Cholesterol, Total: 191 mg/dL (ref 100–199)
HDL: 74 mg/dL (ref >39)
LDL Chol Calc (NIH): 107 mg/dL — ABNORMAL HIGH (ref 0–99)
Triglycerides: 52 mg/dL (ref 0–149)
VLDL Cholesterol Cal: 10 mg/dL (ref 5–40)

## 2024-03-26 LAB — HEPATITIS C ANTIBODY: Hep C Virus Ab: NONREACTIVE

## 2024-03-26 LAB — TSH: TSH: 2.45 u[IU]/mL (ref 0.450–4.500)

## 2024-03-26 LAB — HIV ANTIBODY (ROUTINE TESTING W REFLEX): HIV Screen 4th Generation wRfx: NONREACTIVE

## 2024-04-01 ENCOUNTER — Other Ambulatory Visit (HOSPITAL_COMMUNITY)
Admission: RE | Admit: 2024-04-01 | Discharge: 2024-04-01 | Disposition: A | Discharge status: Home / Self Care | Source: Ambulatory Visit | Attending: Family Medicine | Admitting: Family Medicine

## 2024-04-01 ENCOUNTER — Ambulatory Visit (INDEPENDENT_AMBULATORY_CARE_PROVIDER_SITE_OTHER): Admitting: Family Medicine

## 2024-04-01 ENCOUNTER — Encounter: Payer: Self-pay | Admitting: Family Medicine

## 2024-04-01 ENCOUNTER — Other Ambulatory Visit: Payer: Self-pay | Admitting: Medical Genetics

## 2024-04-01 VITALS — BP 128/79 | HR 54 | Ht 65.0 in | Wt 181.0 lb

## 2024-04-01 DIAGNOSIS — M940 Chondrocostal junction syndrome [Tietze]: Secondary | ICD-10-CM

## 2024-04-01 DIAGNOSIS — Z124 Encounter for screening for malignant neoplasm of cervix: Secondary | ICD-10-CM | POA: Insufficient documentation

## 2024-04-01 DIAGNOSIS — F3341 Major depressive disorder, recurrent, in partial remission: Secondary | ICD-10-CM | POA: Diagnosis not present

## 2024-04-01 DIAGNOSIS — L84 Corns and callosities: Secondary | ICD-10-CM | POA: Diagnosis not present

## 2024-04-01 DIAGNOSIS — Z1211 Encounter for screening for malignant neoplasm of colon: Secondary | ICD-10-CM

## 2024-04-01 DIAGNOSIS — Z1212 Encounter for screening for malignant neoplasm of rectum: Secondary | ICD-10-CM | POA: Diagnosis not present

## 2024-04-01 DIAGNOSIS — Z0000 Encounter for general adult medical examination without abnormal findings: Secondary | ICD-10-CM | POA: Diagnosis not present

## 2024-04-01 DIAGNOSIS — E782 Mixed hyperlipidemia: Secondary | ICD-10-CM

## 2024-04-01 DIAGNOSIS — Z1231 Encounter for screening mammogram for malignant neoplasm of breast: Secondary | ICD-10-CM

## 2024-04-01 DIAGNOSIS — Z006 Encounter for examination for normal comparison and control in clinical research program: Secondary | ICD-10-CM

## 2024-04-01 DIAGNOSIS — E785 Hyperlipidemia, unspecified: Secondary | ICD-10-CM | POA: Insufficient documentation

## 2024-04-01 MED ORDER — BETAMETHASONE DIPROPIONATE 0.05 % EX CREA: TOPICAL_CREAM | Freq: Two times a day (BID) | CUTANEOUS | 0 refills | Status: DC

## 2024-04-01 MED ORDER — BETAMETHASONE DIPROPIONATE 0.05 % EX CREA: TOPICAL_CREAM | Freq: Two times a day (BID) | CUTANEOUS | 1 refills | Status: AC

## 2024-04-01 NOTE — Progress Notes (Signed)
 Established Patient Office Visit  Subjective   Patient ID: Kimberly Snyder, female    DOB: Oct 23, 1980  Age: 43 y.o. MRN: [id number]  Chief Complaint  Patient presents with   Annual Exam    HPI Subjective - Follow up on recent lab work. Denies feeling sick. - New complaint of recurrent sores on feet. They appear as ulcerations. Has had surgery on the right foot previously, but sores continue to appear on both feet. - Reports resolving central chest pain, previously evaluated by Deitra. Described as a constant weight, exacerbated by any upper body movement, including breathing. Pain resolved after reducing physical exertion at work, specifically pushing carts. - Due for screening colonoscopy. Last one was over 10 years ago (estimates 15 years ago). Previous colonoscopies were done at [facility] and were negative for polyps. - Due for screening mammogram. Last one was in June 2024. Reports being at higher risk and is supposed to get them every 6 months. - Due for Pap smear. Last one was in April 2024 and was abnormal.  Medications: Ketamine (Spravato) 28mg  nasal spray, three sprays per treatment, administered in-office 1-2 times per week for MDD and TRD. Trintellix (used as a booster). Xanax. Adderall. All prescribed by Dr. Vincente.  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 PSH: Cholecystectomy (~20 years ago), foot surgery. FH: Heart disease (grandmother died in her 41s, cousin had open heart surgery for a valve issue in her 67s). Cancer (unspecified). Son has eczema. 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 Reports work is stressful.  ROS: Constitutional: Denies active illness. Cardiovascular: Denies current chest pain. Dermatologic: Reports recurrent sores on feet. Occasional eczema flares on hands.    The 10-year ASCVD risk score (Arnett DK, et al., 2019) is:  0.6%  Health Maintenance Due  Topic Date Due   Hepatitis B Vaccines 19-59 Average Risk (1 of 3 - 19+ 3-dose series) Never done   HPV VACCINES (1 - 3-dose SCDM series) Never done   Colonoscopy  Never done   Influenza Vaccine  01/10/2024   COVID-19 Vaccine (1 - 2025-26 season) Never done      Objective:     BP 128/79   Pulse (!) 54   Ht 5' 5 (1.651 m)   Wt 181 lb (82.1 kg)   SpO2 100%   BMI 30.12 kg/m    Physical Exam Gen: alert, oriented HEENT: perrla, eomi, mmm CV: rrr, no murmur Pulm: lctab. No wheeze or crackles.  GI: soft, nbs.  Nontender to palpation GU: normal vaginal exam.  Iud strings visualized.  MSK: strength equal b/l. Normal gait Ext: no pedal edema Skin: warm and dry, small ulcerations noted on plantar aspect feet. The largest is on the right foot this week. Psych: pleasant affect.  Spontaneous speech      Assessment & Plan:   Physical exam, annual  Encounter for screening mammogram for malignant neoplasm of breast -     3D Screening Mammogram, Left and Right; Future  Encounter for colorectal cancer screening -     Ambulatory referral to Gastroenterology  Cervical cancer screening -     Cytology - PAP  Recurrent major depressive disorder, in partial remission Assessment & Plan: - Diagnoses of MDD, TRD, OCD managed by Dr. Latanya. - Continues Spravato treatments 1-2 times weekly with good effect. Reports significant improvement in depressive and OCD symptoms. Also on Trintellix, Xanax, and Adderall. - Continue current management with  psychiatry.   Healthcare maintenance Assessment & Plan: - Due for screening colonoscopy. Last one >10 years ago. Previous were negative for polyps. - Refer to [facility] for colonoscopy. Will attempt to obtain records from [place]. - Due for screening mammogram. Last one was 11/2022. Is at higher risk. - Order screening mammogram. - Due for Pap smear. Last one was 09/2022. - Pap smear performed in office  today.   Costochondritis, acute Assessment & Plan: - Reports recent exertional chest pain, pressure-like, which has resolved with activity modification (stopped pushing carts at work). - Instructed on doorway stretches for pectoral muscles. - Continue to avoid aggravating activities. Return if symptoms recur.   Callus of foot Assessment & Plan: - Reports recurrent, painful sores on the soles of both feet. Examination reveals sores. History of eczema. Differential includes palmoplantar psoriasis vs. recurrent corns. - Prescribe a topical steroid. - If no improvement with topical steroid, refer to Podiatry for evaluation and possible biopsy/shaving.   Moderate mixed hyperlipidemia not requiring statin therapy Assessment & Plan: - Recent labs show mildly elevated cholesterol. Overall 10-year ASCVD risk is low. Strong family history of premature heart disease. - Order additional lipid panel labs to further assess risk. - Continue lifestyle modifications.   Other orders -     Betamethasone Dipropionate; Apply topically 2 (two) times daily. To the soles of the feet  Dispense: 45 g; Refill: 1     Return in about 1 year (around 04/01/2025) for physical.    Toribio MARLA Slain, MD

## 2024-04-01 NOTE — Assessment & Plan Note (Signed)
-   Reports recurrent, painful sores on the soles of both feet. Examination reveals sores. History of eczema. Differential includes palmoplantar psoriasis vs. recurrent corns. - Prescribe a topical steroid. - If no improvement with topical steroid, refer to Podiatry for evaluation and possible biopsy/shaving.

## 2024-04-01 NOTE — Patient Instructions (Addendum)
 It was nice to see you today,  We addressed the following topics today:  - I have written you a prescription for a topical steroid cream for the sores on your feet. Apply it as directed. If the sores do not improve, the next step would be to see a podiatrist. - I will show you how to do some stretches to help with your chest wall muscles. - I am referring you to [facility] for a screening colonoscopy. - I am also placing an order for you to get a screening mammogram. - I will let you know the results of your labs when I get them.    Have a great day,  Rolan Slain, MD

## 2024-04-01 NOTE — Assessment & Plan Note (Signed)
-   Due for screening colonoscopy. Last one >10 years ago. Previous were negative for polyps. - Refer to [facility] for colonoscopy. Will attempt to obtain records from [place]. - Due for screening mammogram. Last one was 11/2022. Is at higher risk. - Order screening mammogram. - Due for Pap smear. Last one was 09/2022. - Pap smear performed in office today.

## 2024-04-01 NOTE — Assessment & Plan Note (Signed)
-   Diagnoses of MDD, TRD, OCD managed by Dr. Latanya. - Continues Spravato treatments 1-2 times weekly with good effect. Reports significant improvement in depressive and OCD symptoms. Also on Trintellix, Xanax, and Adderall. - Continue current management with psychiatry.

## 2024-04-01 NOTE — Assessment & Plan Note (Signed)
-   Reports recent exertional chest pain, pressure-like, which has resolved with activity modification (stopped pushing carts at work). - Instructed on doorway stretches for pectoral muscles. - Continue to avoid aggravating activities. Return if symptoms recur.

## 2024-04-01 NOTE — Assessment & Plan Note (Signed)
-   Recent labs show mildly elevated cholesterol. Overall 10-year ASCVD risk is low. Strong family history of premature heart disease. - Order additional lipid panel labs to further assess risk. - Continue lifestyle modifications.

## 2024-04-03 LAB — CYTOLOGY - PAP

## 2024-04-05 ENCOUNTER — Ambulatory Visit: Payer: Self-pay | Admitting: Family Medicine

## 2024-04-06 ENCOUNTER — Other Ambulatory Visit: Payer: Self-pay | Admitting: Family Medicine

## 2024-04-06 DIAGNOSIS — R87619 Unspecified abnormal cytological findings in specimens from cervix uteri: Secondary | ICD-10-CM

## 2024-04-06 NOTE — Progress Notes (Signed)
 Called pt she is advised of her lab results recommendation she is agreeable to the referral

## 2024-04-07 ENCOUNTER — Emergency Department (HOSPITAL_BASED_OUTPATIENT_CLINIC_OR_DEPARTMENT_OTHER)

## 2024-04-07 ENCOUNTER — Other Ambulatory Visit: Payer: Self-pay

## 2024-04-07 ENCOUNTER — Ambulatory Visit: Payer: Self-pay

## 2024-04-07 ENCOUNTER — Emergency Department (HOSPITAL_BASED_OUTPATIENT_CLINIC_OR_DEPARTMENT_OTHER)
Admission: EM | Admit: 2024-04-07 | Discharge: 2024-04-07 | Disposition: A | Discharge status: Home / Self Care | Source: Ambulatory Visit | Attending: Emergency Medicine | Admitting: Emergency Medicine

## 2024-04-07 ENCOUNTER — Other Ambulatory Visit

## 2024-04-07 ENCOUNTER — Ambulatory Visit: Admission: EM | Admit: 2024-04-07 | Discharge: 2024-04-07 | Discharge status: Against Medical Advice

## 2024-04-07 DIAGNOSIS — R1030 Lower abdominal pain, unspecified: Secondary | ICD-10-CM | POA: Diagnosis not present

## 2024-04-07 DIAGNOSIS — K429 Umbilical hernia without obstruction or gangrene: Secondary | ICD-10-CM | POA: Diagnosis not present

## 2024-04-07 DIAGNOSIS — I10 Essential (primary) hypertension: Secondary | ICD-10-CM | POA: Diagnosis not present

## 2024-04-07 DIAGNOSIS — R102 Pelvic and perineal pain unspecified side: Secondary | ICD-10-CM | POA: Diagnosis not present

## 2024-04-07 DIAGNOSIS — R1032 Left lower quadrant pain: Secondary | ICD-10-CM | POA: Diagnosis not present

## 2024-04-07 DIAGNOSIS — E039 Hypothyroidism, unspecified: Secondary | ICD-10-CM | POA: Diagnosis not present

## 2024-04-07 DIAGNOSIS — K439 Ventral hernia without obstruction or gangrene: Secondary | ICD-10-CM | POA: Diagnosis not present

## 2024-04-07 DIAGNOSIS — M5442 Lumbago with sciatica, left side: Secondary | ICD-10-CM | POA: Diagnosis not present

## 2024-04-07 DIAGNOSIS — N854 Malposition of uterus: Secondary | ICD-10-CM | POA: Diagnosis not present

## 2024-04-07 DIAGNOSIS — M79662 Pain in left lower leg: Secondary | ICD-10-CM | POA: Diagnosis not present

## 2024-04-07 DIAGNOSIS — Z975 Presence of (intrauterine) contraceptive device: Secondary | ICD-10-CM | POA: Diagnosis not present

## 2024-04-07 LAB — WET PREP, GENITAL
Clue Cells Wet Prep HPF POC: NONE SEEN
Sperm: NONE SEEN
Trich, Wet Prep: NONE SEEN
WBC, Wet Prep HPF POC: <10 (ref <10)
Yeast Wet Prep HPF POC: NONE SEEN

## 2024-04-07 LAB — BASIC METABOLIC PANEL WITH GFR
Anion gap: 6 (ref 5–15)
BUN: 11 mg/dL (ref 6–20)
CO2: 29 mmol/L (ref 22–32)
Calcium: 9.1 mg/dL (ref 8.9–10.3)
Chloride: 107 mmol/L (ref 98–111)
Creatinine, Ser: 0.9 mg/dL (ref 0.44–1.00)
GFR, Estimated: >60 mL/min (ref >60)
Glucose, Bld: 81 mg/dL (ref 70–99)
Potassium: 4.6 mmol/L (ref 3.5–5.1)
Sodium: 142 mmol/L (ref 135–145)

## 2024-04-07 LAB — CBC WITH DIFFERENTIAL/PLATELET
Abs Immature Granulocytes: 0.01 K/uL (ref 0.00–0.07)
Basophils Absolute: 0 K/uL (ref 0.0–0.1)
Basophils Relative: 1 %
Eosinophils Absolute: 0.1 K/uL (ref 0.0–0.5)
Eosinophils Relative: 4 %
HCT: 40.4 % (ref 36.0–46.0)
Hemoglobin: 13.4 g/dL (ref 12.0–15.0)
Immature Granulocytes: 0 %
Lymphocytes Relative: 41 %
Lymphs Abs: 1.3 K/uL (ref 0.7–4.0)
MCH: 31 pg (ref 26.0–34.0)
MCHC: 33.2 g/dL (ref 30.0–36.0)
MCV: 93.5 fL (ref 80.0–100.0)
Monocytes Absolute: 0.3 K/uL (ref 0.1–1.0)
Monocytes Relative: 10 %
Neutro Abs: 1.4 K/uL — ABNORMAL LOW (ref 1.7–7.7)
Neutrophils Relative %: 44 %
Platelets: 204 K/uL (ref 150–400)
RBC: 4.32 MIL/uL (ref 3.87–5.11)
RDW: 13.1 % (ref 11.5–15.5)
WBC: 3.2 K/uL — ABNORMAL LOW (ref 4.0–10.5)
nRBC: 0 % (ref 0.0–0.2)

## 2024-04-07 LAB — URINALYSIS, W/ REFLEX TO CULTURE (INFECTION SUSPECTED)
Bacteria, UA: NONE SEEN
Bilirubin Urine: NEGATIVE
Glucose, UA: NEGATIVE mg/dL
Ketones, ur: NEGATIVE mg/dL
Leukocytes,Ua: NEGATIVE
Nitrite: NEGATIVE
Protein, ur: NEGATIVE mg/dL
Specific Gravity, Urine: 1.018 (ref 1.005–1.030)
pH: 7.5 (ref 5.0–8.0)

## 2024-04-07 LAB — LIPASE, BLOOD: Lipase: 33 U/L (ref 11–51)

## 2024-04-07 LAB — HEPATIC FUNCTION PANEL
ALT: 8 U/L (ref 0–44)
AST: 16 U/L (ref 15–41)
Albumin: 4.2 g/dL (ref 3.5–5.0)
Alkaline Phosphatase: 54 U/L (ref 38–126)
Bilirubin, Direct: <0.1 mg/dL (ref 0.0–0.2)
Total Bilirubin: <0.2 mg/dL (ref 0.0–1.2)
Total Protein: 6.4 g/dL — ABNORMAL LOW (ref 6.5–8.1)

## 2024-04-07 MED ORDER — SODIUM CHLORIDE 0.9 % IV BOLUS
1000.0000 mL | Freq: Once | INTRAVENOUS | Status: AC
  Administered 2024-04-07: 1000 mL via INTRAVENOUS

## 2024-04-07 MED ORDER — HYDROCODONE-ACETAMINOPHEN 5-325 MG PO TABS
1.0000 | ORAL_TABLET | Freq: Once | ORAL | Status: AC
  Administered 2024-04-07: 1 via ORAL
  Filled 2024-04-07: qty 1

## 2024-04-07 MED ORDER — LIDOCAINE 5 % EX PTCH
1.0000 | MEDICATED_PATCH | CUTANEOUS | Status: DC
  Administered 2024-04-07: 1 via TRANSDERMAL
  Filled 2024-04-07: qty 1

## 2024-04-07 MED ORDER — FENTANYL CITRATE (PF) 50 MCG/ML IJ SOSY
50.0000 ug | PREFILLED_SYRINGE | Freq: Once | INTRAMUSCULAR | Status: AC
  Administered 2024-04-07: 50 ug via INTRAVENOUS
  Filled 2024-04-07: qty 1

## 2024-04-07 MED ORDER — CYCLOBENZAPRINE HCL 5 MG PO TABS
5.0000 mg | ORAL_TABLET | Freq: Once | ORAL | Status: AC
  Administered 2024-04-07: 5 mg via ORAL
  Filled 2024-04-07: qty 1

## 2024-04-07 MED ORDER — KETOROLAC TROMETHAMINE 15 MG/ML IJ SOLN
15.0000 mg | Freq: Once | INTRAMUSCULAR | Status: AC
  Administered 2024-04-07: 15 mg via INTRAVENOUS
  Filled 2024-04-07: qty 1

## 2024-04-07 MED ORDER — IOHEXOL 300 MG/ML  SOLN
100.0000 mL | Freq: Once | INTRAMUSCULAR | Status: AC | PRN
  Administered 2024-04-07: 100 mL via INTRAVENOUS

## 2024-04-07 NOTE — Discharge Instructions (Addendum)
 Your CT imaging revealed: IMPRESSION:  1. No acute findings in the abdomen or pelvis.  2. T-shaped intrauterine device in expected position within the endometrium.  3. Supraumbilical ventral wall hernia containing fat with a tiny abdominal wall  defect measuring 0.1 cm. Slight fat stranding of the contained, likely omental,  fat. . Correlate for incarceration.  4. Tiny fat-containing umbilical hernia with a 1.5 cm abdominal wall defect.    Your US  revealed: IMPRESSION:  1. Intrauterine device appropriately positioned within the endometrial cavity.  2. Left ovary not visualized; no left adnexal mass.  3. Trace physiologic free fluid in the cul-de-sac.

## 2024-04-07 NOTE — ED Triage Notes (Signed)
 Patient reports groin pain and vaginal bleeding that began after pap smear. States pain radiates down left leg.

## 2024-04-07 NOTE — ED Notes (Signed)
Questions and concerns addressed. Discharge teaching completed. Pt ambulatory upon discharge.   

## 2024-04-07 NOTE — ED Provider Notes (Signed)
 [place] [facility] AT [facility] Provider Note   CSN: [id number] Arrival date & time: 04/07/24  1429     Patient presents with: Groin Pain   Kimberly Snyder is a 43 y.o. female.  {Add pertinent medical, surgical, social history, OB history to HPI:32947}  Groin Pain     43 year old female with history significant for depression, ADHD, Chiari malformation, hypothyroidism, HTN, HLD presenting to the emergency department with multiple complaints.  The patient states that she has chronically had left lower extremity swelling and pain.  She also states that she has chronic back pain with radiation of pain down her left leg.  She endorses weakness in the setting of pain.  She denies any urinary or fecal incontinence that is new.  She did have a single episode of having to run to the bathroom 2 weeks ago.  She denies IV drug use.  Denies any fevers or chills.  She primarily presents to the emergency department today due to a chief complaint of left lower quadrant abdominal pain.  She underwent Pap smear on 10/22 and cytology results were positive for atypical glandular cells and she was referred for outpatient gynecology follow-up for biopsy of her cervix.  Since her Pap smear she has had mild vaginal spotting, denies soaking through any panty liner.  She denies dysuria or frequency.  Prior to Admission medications   Medication Sig Start Date End Date Taking? Authorizing Provider  ALPRAZolam (XANAX) 1 MG tablet Take 1 mg by mouth 3 (three) times daily.    [provider]  amphetamine-dextroamphetamine (ADDERALL) 20 MG tablet Take 20 mg by mouth 4 (four) times daily.     [provider]  betamethasone dipropionate 0.05 % cream Apply topically 2 (two) times daily. To the soles of the feet 04/01/24   Chandra Toribio POUR, MD  celecoxib  (CELEBREX ) 100 MG capsule Take 1 capsule (100 mg total) by mouth 2 (two) times daily. 12/31/23   Gayle Saddie FALCON, PA-C   cyclobenzaprine  (FLEXERIL ) 10 MG tablet Take 1 tablet (10 mg total) by mouth 3 (three) times daily as needed for muscle spasms. 12/31/23   Gayle Saddie FALCON, PA-C  Esketamine HCl (SPRAVATO, 84 MG DOSE, NA) Place into the nose.    [provider]  ibuprofen  (ADVIL ) 800 MG tablet Take 1 tablet (800 mg total) by mouth every 6 (six) hours as needed. 10/29/22   Tobie Franky SQUIBB, DPM  vortioxetine HBr (TRINTELLIX) 10 MG TABS tablet Take 10 mg by mouth daily.    [provider]    Allergies: Wasp venom    Review of Systems  All other systems reviewed and are negative.   Updated Vital Signs BP 136/83   Pulse (!) 44   Temp 98.3 F (36.8 C) (Oral)   Resp 15   SpO2 100%   Physical Exam Vitals and nursing note reviewed. Exam conducted with a chaperone present.  Constitutional:      General: She is not in acute distress.    Appearance: She is well-developed.  HENT:     Head: Normocephalic and atraumatic.  Eyes:     Conjunctiva/sclera: Conjunctivae normal.  Cardiovascular:     Rate and Rhythm: Normal rate and regular rhythm.  Pulmonary:     Effort: Pulmonary effort is normal. No respiratory distress.     Breath sounds: Normal breath sounds.  Abdominal:     Palpations: Abdomen is soft.     Tenderness: There is abdominal tenderness in the left lower  quadrant. There is guarding. There is no rebound.  Genitourinary:    Cervix: Cervical motion tenderness and friability present. No cervical bleeding.     Adnexa: Right adnexa normal.       Left: Tenderness present.   Musculoskeletal:        General: No swelling.     Cervical back: Neck supple.     Comments: No midline tenderness of the cervical thoracic or lumbar spine.  Paraspinal muscular tenderness on the left.  Positive straight leg raise test on the left.  Skin:    General: Skin is warm and dry.     Capillary Refill: Capillary refill takes less than 2 seconds.  Neurological:     Mental Status: She is alert.     Comments:  5 out of 5 strength in the bilateral upper extremities, 4-5 strength in the left lower extremity, appears to be more effort dependent in the setting of pain, 5 out of 5 strength in the right lower extremity, intact sensation to light touch all 4 extremities  Psychiatric:        Mood and Affect: Mood normal.     (all labs ordered are listed, but only abnormal results are displayed) Labs Reviewed  CBC WITH DIFFERENTIAL/PLATELET - Abnormal; Notable for the following components:      Result Value   WBC 3.2 (*)    Neutro Abs 1.4 (*)    All other components within normal limits  URINALYSIS, W/ REFLEX TO CULTURE (INFECTION SUSPECTED) - Abnormal; Notable for the following components:   Hgb urine dipstick SMALL (*)    All other components within normal limits  HEPATIC FUNCTION PANEL - Abnormal; Notable for the following components:   Total Protein 6.4 (*)    All other components within normal limits  WET PREP, GENITAL  BASIC METABOLIC PANEL WITH GFR  LIPASE, BLOOD  GC/CHLAMYDIA PROBE AMP ([place]) NOT AT [facility]    EKG: None  Radiology: US  PELVIC COMPLETE W TRANSVAGINAL AND TORSION R/O Result Date: 04/07/2024 EXAM: US  Pelvis, Complete Transvaginal and Transabdominal with Doppler 04/07/2024 10:28:21 PM TECHNIQUE: Transabdominal and transvaginal pelvic duplex ultrasound using B-mode/gray scaled imaging with Doppler spectral analysis and color flow was obtained. COMPARISON: 09/16/2015 CLINICAL HISTORY: Left adnexal tenderness. FINDINGS: UTERUS: The uterus is anteverted. The cervix is unremarkable. No intrauterine masses are seen. The uterus measures 7.2 x 3.5 x 4.3 cm with a volume of 56 cc. Uterus demonstrates normal myometrial echotexture. ENDOMETRIAL STRIPE: Intrauterine device (IUD) noted within the endometrial cavity. The endometrium measures 5 mm in thickness. RIGHT OVARY: No right adnexal or ovarian mass. The right ovary measures 2.3 x 1.4 x 1.5 cm with a volume of 3 cc. There is normal  arterial and venous Doppler flow. LEFT OVARY: Left ovary not visualized. No adnexal mass visualized within the left adnexa. FREE FLUID: Trace free fluid within the cul-de-sac. IMPRESSION: 1. Intrauterine device appropriately positioned within the endometrial cavity. 2. Left ovary not visualized; no left adnexal mass. 3. Trace physiologic free fluid in the cul-de-sac. Electronically signed by: Dorethia Molt MD 04/07/2024 10:53 PM EDT RP Workstation: [id number]   CT L-SPINE NO CHARGE Result Date: 04/07/2024 EXAM: CT OF THE LUMBAR SPINE WITHOUT CONTRAST 04/07/2024 07:14:10 PM TECHNIQUE: CT of the lumbar spine was performed without the administration of intravenous contrast. Multiplanar reformatted images are provided for review. Automated exposure control, iterative reconstruction, and/or weight based adjustment of the mA/kV was utilized to reduce the radiation dose to as low as reasonably achievable. COMPARISON: None  available. CLINICAL HISTORY: Patient reports groin pain and vaginal bleeding that began after pap smear. States pain radiates down left leg. FINDINGS: BONES AND ALIGNMENT: Normal vertebral body heights. No acute fracture or suspicious bone lesion. Normal alignment. DEGENERATIVE CHANGES: No significant degenerative changes. SOFT TISSUES: No acute abnormality. IMPRESSION: 1. Unremarkable CT of the lumbar spine Electronically signed by: Kate Plummer MD 04/07/2024 07:39 PM EDT RP Workstation: [id number]   CT ABDOMEN PELVIS W CONTRAST Result Date: 04/07/2024 EXAM: CT ABDOMEN AND PELVIS WITH CONTRAST 04/07/2024 07:14:10 PM TECHNIQUE: CT of the abdomen and pelvis was performed with the administration of 100 mL of iohexol (OMNIPAQUE) 300 MG/ML solution. Multiplanar reformatted images are provided for review. Automated exposure control, iterative reconstruction, and/or weight-based adjustment of the mA/kV was utilized to reduce the radiation dose to as low as reasonably achievable. COMPARISON: None  available. CLINICAL HISTORY: Abdominal pain, acute, nonlocalized; Abd/groin pain. Patient reports groin pain and vaginal bleeding that began after pap smear. States pain radiates down left leg. FINDINGS: LOWER CHEST: No acute abnormality. LIVER: The liver is unremarkable. GALLBLADDER AND BILE DUCTS: Status post cholecystectomy. No biliary ductal dilatation. SPLEEN: No acute abnormality. PANCREAS: No acute abnormality. ADRENAL GLANDS: No acute abnormality. KIDNEYS, URETERS AND BLADDER: No stones in the kidneys or ureters. No hydronephrosis. No perinephric or periureteral stranding. No filling defects of the partially visualized collecting systems on delayed imaging. Urinary bladder is unremarkable. GI AND BOWEL: Stomach demonstrates no acute abnormality. No small or large bowel thickening or dilatation. The appendix is unremarkable. Stool throughout the majority of the colon. There is no bowel obstruction. PERITONEUM AND RETROPERITONEUM: No ascites. No free air. VASCULATURE: Aorta is normal in caliber. LYMPH NODES: No lymphadenopathy. REPRODUCTIVE ORGANS: T-shaped intrauterine device in grossly appropriate position within the endometrium. The uterus is otherwise unremarkable. No adnexal mass. BONES AND SOFT TISSUES: Supraumbilical ventral wall hernia containing fat with a tiny 1 mm abdominal defect (2:.34). Slight fat stranding of the contained, likely omental, fat. Tiny fat-containing umbilical hernia with an abdominal defect of 1.5 cm. No acute osseous abnormality. Choices have really dictated CT lumbar spine on 08/09/23. Old healed distal sacrum/coccyx fracture. IMPRESSION: 1. No acute findings in the abdomen or pelvis. 2. T-shaped intrauterine device in expected position within the endometrium. 3. Supraumbilical ventral wall hernia containing fat with a tiny abdominal wall defect measuring 0.1 cm. Slight fat stranding of the contained, likely omental, fat. . Correlate for incarceration. 4. Tiny fat-containing  umbilical hernia with a 1.5 cm abdominal wall defect. Electronically signed by: Morgane Naveau MD 04/07/2024 07:35 PM EDT RP Workstation: [id number]   US  Venous Img Lower Unilateral Left Result Date: 04/07/2024 CLINICAL DATA:  Left groin pain radiating into lower extremity. EXAM: LEFT LOWER EXTREMITY VENOUS DOPPLER ULTRASOUND TECHNIQUE: Gray-scale sonography with graded compression, as well as color Doppler and duplex ultrasound were performed to evaluate the lower extremity deep venous systems from the level of the common femoral vein and including the common femoral, femoral, profunda femoral, popliteal and calf veins including the posterior tibial, peroneal and gastrocnemius veins when visible. The superficial great saphenous vein was also interrogated. Spectral Doppler was utilized to evaluate flow at rest and with distal augmentation maneuvers in the common femoral, femoral and popliteal veins. COMPARISON:  None Available. FINDINGS: Contralateral Common Femoral Vein: Respiratory phasicity is normal and symmetric with the symptomatic side. No evidence of thrombus. Normal compressibility. Common Femoral Vein: No evidence of thrombus. Normal compressibility, respiratory phasicity and response to augmentation. Saphenofemoral Junction: No evidence of thrombus.  Normal compressibility and flow on color Doppler imaging. Profunda Femoral Vein: No evidence of thrombus. Normal compressibility and flow on color Doppler imaging. Femoral Vein: No evidence of thrombus. Normal compressibility, respiratory phasicity and response to augmentation. Popliteal Vein: No evidence of thrombus. Normal compressibility, respiratory phasicity and response to augmentation. Calf Veins: No evidence of thrombus. Normal compressibility and flow on color Doppler imaging. Superficial Great Saphenous Vein: No evidence of thrombus. Normal compressibility. Venous Reflux:  None. Other Findings: No evidence of superficial thrombophlebitis or  abnormal fluid collection. IMPRESSION: No evidence of left lower extremity deep venous thrombosis. Electronically Signed   By: Marcey Moan M.D.   On: 04/07/2024 16:03    {Document cardiac monitor, telemetry assessment procedure when appropriate:32947} Procedures   Medications Ordered in the ED  lidocaine (LIDODERM) 5 % 1 patch (1 patch Transdermal Patch Applied 04/07/24 2133)  fentaNYL (SUBLIMAZE) injection 50 mcg (50 mcg Intravenous Given 04/07/24 1631)  sodium chloride  0.9 % bolus 1,000 mL (0 mLs Intravenous Stopped 04/07/24 1744)  iohexol (OMNIPAQUE) 300 MG/ML solution 100 mL (100 mLs Intravenous Contrast Given 04/07/24 1903)  fentaNYL (SUBLIMAZE) injection 50 mcg (50 mcg Intravenous Given 04/07/24 1948)  ketorolac  (TORADOL ) 15 MG/ML injection 15 mg (15 mg Intravenous Given 04/07/24 2134)  cyclobenzaprine  (FLEXERIL ) tablet 5 mg (5 mg Oral Given 04/07/24 2133)  HYDROcodone -acetaminophen  (NORCO/VICODIN) 5-325 MG per tablet 1 tablet (1 tablet Oral Given 04/07/24 2133)      {Click here for ABCD2, HEART and other calculators REFRESH Note before signing:1}                              Medical Decision Making Amount and/or Complexity of Data Reviewed Labs: ordered. Radiology: ordered.  Risk Prescription drug management.    43 year old female with history significant for depression, ADHD, Chiari malformation, hypothyroidism, HTN, HLD presenting to the emergency department with multiple complaints.  The patient states that she has chronically had left lower extremity swelling and pain.  She also states that she has chronic back pain with radiation of pain down her left leg.  She endorses weakness in the setting of pain.  She denies any urinary or fecal incontinence that is new.  She did have a single episode of having to run to the bathroom 2 weeks ago.  She denies IV drug use.  Denies any fevers or chills.  She primarily presents to the emergency department today due to a chief complaint  of left lower quadrant abdominal pain.  She underwent Pap smear on 10/22 and cytology results were positive for atypical glandular cells and she was referred for outpatient gynecology follow-up for biopsy of her cervix.  Since her Pap smear she has had mild vaginal spotting, denies soaking through any panty liner.  She denies dysuria or frequency.  On arrival, the patient was afebrile, mildly bradycardic heart rate 53, nontachypneic, BP 121/72, saturating 100% on room air.  Patient on exam had left lower quadrant tenderness to palpation, mild guarding present.  She also had left-sided CVA/paraspinal muscular tenderness, no midline tenderness.  She had a positive straight leg raise test on the left.  She endorses cramping and swelling in her left thigh.  Considered DVT, considered spinal stenosis or degenerative disc disease with associated radiculopathy.  Lower concern for epidural abscess, discitis or osteomyelitis.  Low concern for cauda equina syndrome.  Patient last bowel movement was yesterday and was normal.  She has left lower quadrant tenderness on exam.  Considered diverticulitis, pyelonephritis, nephrolithiasis, other acute intra-abdominal emergency.  Given the patient's abnormal cells seen on cytology, mild spotting, considered malignancy.  The access was obtained and the patient was administered IV Fentanyl and IV fluid bolus for volume resuscitation.  DVT ultrasound performed negative for DVT.  Labs: ***  CT Abd Pel:  IMPRESSION:  1. No acute findings in the abdomen or pelvis.  2. T-shaped intrauterine device in expected position within the endometrium.  3. Supraumbilical ventral wall hernia containing fat with a tiny abdominal wall  defect measuring 0.1 cm. Slight fat stranding of the contained, likely omental,  fat. . Correlate for incarceration.  4. Tiny fat-containing umbilical hernia with a 1.5 cm abdominal wall defect.   CT L spine no charge:    IMPRESSION:  1. Unremarkable CT  of the lumbar spine   Pelvic US : IMPRESSION:  1. Intrauterine device appropriately positioned within the endometrial cavity.  2. Left ovary not visualized; no left adnexal mass.  3. Trace physiologic free fluid in the cul-de-sac.    {Document critical care time when appropriate  Document review of labs and clinical decision tools ie CHADS2VASC2, etc  Document your independent review of radiology images and any outside records  Document your discussion with family members, caretakers and with consultants  Document social determinants of health affecting pt's care  Document your decision making why or why not admission, treatments were needed:32947:::1}   Final diagnoses:  Pelvic pain  Acute left-sided low back pain with left-sided sciatica    ED Discharge Orders          Ordered    Ambulatory referral to Obstetrics / Gynecology        04/07/24 2312

## 2024-04-07 NOTE — Telephone Encounter (Signed)
 FYI Only or Action Required?: FYI only for provider.  Patient was last seen in primary care on 04/01/2024 by Chandra Toribio POUR, MD.  Called Nurse Triage reporting Abdominal Pain.  Symptoms began yesterday.  Interventions attempted: Nothing.  Symptoms are: LLQ abdominal pain moderate to severe and radiates to left knee gradually worsening. Patient also mentions she has had left groin pain and limited ROM with left leg for 3 months.  Triage Disposition: See HCP Within 4 Hours (Or PCP Triage)  Patient/caregiver understands and will follow disposition?: Yes             Copied from CRM [id number]. Topic: Clinical - Red Word Triage >> Apr 07, 2024 12:09 PM Lonell PEDLAR wrote: Red Word that prompted transfer to Nurse Triage: Patient with sharp pain on left side, lower abdomen to knee. Reason for Disposition  [1] MILD-MODERATE pain AND [2] constant AND [3] present > 2 hours  Answer Assessment - Initial Assessment Questions 1. LOCATION: Where does it hurt?      Left lower abdominal.  2. RADIATION: Does the pain shoot anywhere else? (e.g., chest, back)     Down to left knee.  3. ONSET: When did the pain begin? (e.g., minutes, hours or days ago)      Teacher, [employer] Language.  4. SUDDEN: Gradual or sudden onset?     Sudden.  5. PATTERN Does the pain come and go, or is it constant?     Constant.  6. SEVERITY: How bad is the pain?  (e.g., Scale 1-10; mild, moderate, or severe)     6/10 at rest, worse when standing 8-9/10.  7. RECURRENT SYMPTOM: Have you ever had this type of stomach pain before? If Yes, ask: When was the last time? and What happened that time?      No.  8. CAUSE: What do you think is causing the stomach pain? (e.g., gallstones, recent abdominal surgery)     She states she thinks this is related to her IUD and feels like it is not where it is supposed to be. She states for 3 months she has been having left groin pain and limited ROM of left leg, she states pain  if walking up steps.  9. RELIEVING/AGGRAVATING FACTORS: What makes it better or worse? (e.g., antacids, bending or twisting motion, bowel movement)     Standing, climbing steps and certain movements send a sharp pain. Better when lying/resting.  10. OTHER SYMPTOMS: Do you have any other symptoms? (e.g., back pain, diarrhea, fever, urination pain, vomiting)       Denies nausea, vomiting. Vaginal bleeding (not enough to fill a panty liner, just when wiping notices the blood on toilet tissue) x 3 days.  Protocols used: Abdominal Pain - Female-A-AH

## 2024-04-09 LAB — GC/CHLAMYDIA PROBE AMP (~~LOC~~) NOT AT ARMC
Chlamydia: NEGATIVE
Comment: NEGATIVE
Comment: NORMAL
Neisseria Gonorrhea: NEGATIVE

## 2024-04-22 DIAGNOSIS — F332 Major depressive disorder, recurrent severe without psychotic features: Secondary | ICD-10-CM | POA: Diagnosis not present

## 2024-05-06 ENCOUNTER — Ambulatory Visit
Admission: RE | Admit: 2024-05-06 | Discharge: 2024-05-06 | Disposition: A | Source: Ambulatory Visit | Attending: Family Medicine

## 2024-05-06 DIAGNOSIS — Z1231 Encounter for screening mammogram for malignant neoplasm of breast: Secondary | ICD-10-CM | POA: Diagnosis not present

## 2024-05-11 DIAGNOSIS — F332 Major depressive disorder, recurrent severe without psychotic features: Secondary | ICD-10-CM | POA: Diagnosis not present

## 2024-05-12 LAB — GENECONNECT MOLECULAR SCREEN: Genetic Analysis Overall Interpretation: NEGATIVE

## 2024-05-15 DIAGNOSIS — F332 Major depressive disorder, recurrent severe without psychotic features: Secondary | ICD-10-CM | POA: Diagnosis not present

## 2024-05-18 DIAGNOSIS — F332 Major depressive disorder, recurrent severe without psychotic features: Secondary | ICD-10-CM | POA: Diagnosis not present

## 2024-05-21 DIAGNOSIS — F332 Major depressive disorder, recurrent severe without psychotic features: Secondary | ICD-10-CM | POA: Diagnosis not present

## 2024-05-25 DIAGNOSIS — F332 Major depressive disorder, recurrent severe without psychotic features: Secondary | ICD-10-CM | POA: Diagnosis not present

## 2024-06-08 DIAGNOSIS — F332 Major depressive disorder, recurrent severe without psychotic features: Secondary | ICD-10-CM | POA: Diagnosis not present

## 2024-06-24 ENCOUNTER — Ambulatory Visit

## 2025-04-02 ENCOUNTER — Encounter: Admitting: Family Medicine

## 2025-04-06 ENCOUNTER — Encounter: Admitting: Family Medicine
